# Patient Record
Sex: Female | Born: 1995 | Race: Black or African American | Hispanic: No | Marital: Single | Smoking: Never smoker
Health system: Southern US, Community
[De-identification: ages and names within clinical notes are randomized; demographics above are authoritative.]

## PROBLEM LIST (undated history)

## (undated) DIAGNOSIS — Z9109 Other allergy status, other than to drugs and biological substances: Secondary | ICD-10-CM

## (undated) DIAGNOSIS — L309 Dermatitis, unspecified: Secondary | ICD-10-CM

---
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
## 2016-02-05 ENCOUNTER — Emergency Department (HOSPITAL_COMMUNITY)
Admission: EM | Admit: 2016-02-05 | Discharge: 2016-02-05 | Disposition: A | Discharge status: Home / Self Care | Payer: Medicaid Other | Attending: Emergency Medicine | Admitting: Emergency Medicine

## 2016-02-05 ENCOUNTER — Encounter (HOSPITAL_COMMUNITY): Payer: Self-pay | Admitting: Emergency Medicine

## 2016-02-05 DIAGNOSIS — R519 Headache, unspecified: Secondary | ICD-10-CM

## 2016-02-05 DIAGNOSIS — R51 Headache: Secondary | ICD-10-CM | POA: Diagnosis present

## 2016-02-05 DIAGNOSIS — Z79899 Other long term (current) drug therapy: Secondary | ICD-10-CM | POA: Insufficient documentation

## 2016-02-05 MED ORDER — FLUTICASONE PROPIONATE 50 MCG/ACT NA SUSP: 1.0000 | Freq: Every day | NASAL | Status: DC

## 2016-02-05 MED ORDER — CETIRIZINE HCL 5 MG PO TABS
5.0000 mg | ORAL_TABLET | Freq: Every day | ORAL | Status: DC
Start: 2016-02-05 — End: 2016-11-25

## 2016-02-05 NOTE — ED Notes (Signed)
Patient arrives with complaint of aching pain to a specific area behind her right ear. Describes it as a "knot". Area appears mildly raised and soft, nontender. Patient states moderate nagging ache to that area with periodic radiation to rest of head. Denies neuro/vision abnormalities. Denies history of idiopathic migraines, but states that "loud music causes them".

## 2016-02-05 NOTE — Discharge Instructions (Signed)
Read the information below.   Your neurologic exam was re-assuring.  I have prescribed flonase and an allergy medication for relief of your allergy symptoms.  Use the prescribed medication as directed.  Please discuss all new medications with your pharmacist.   It is important that you follow up with a primary care provider. I have provided the [facility] information [facility].  You may return to the Emergency Department at any time for worsening condition or any new symptoms that concern you. Return to ED if your symptoms worsen or you develop any new symptoms such as dizziness, lightheadedness, changes in vision, numbness/weakness, or fever.     General Headache Without Cause A headache is pain or discomfort felt around the head or neck area. There are many causes and types of headaches. In some cases, the cause may not be found.  HOME CARE  Managing Pain  Take over-the-counter and prescription medicines only as told by your doctor.  Lie down in a dark, quiet room when you have a headache.  If directed, apply ice to the head and neck area:  Put ice in a plastic bag.  Place a towel between your skin and the bag.  Leave the ice on for 20 minutes, 2-3 times per day.  Use a heating pad or hot shower to apply heat to the head and neck area as told by your doctor.  Keep lights dim if bright lights bother you or make your headaches worse. Eating and Drinking  Eat meals on a regular schedule.  Lessen how much alcohol you drink.  Lessen how much caffeine you drink, or stop drinking caffeine. General Instructions  Keep all follow-up visits as told by your doctor. This is important.  Keep a journal to find out if certain things bring on headaches. For example, write down:  What you eat and drink.  How much sleep you get.  Any change to your diet or medicines.  Relax by getting a massage or doing other relaxing activities.  Lessen stress.  Sit up  straight. Do not tighten (tense) your muscles.  Do not use tobacco products. This includes cigarettes, chewing tobacco, or e-cigarettes. If you need help quitting, ask your doctor.  Exercise regularly as told by your doctor.  Get enough sleep. This often means 7-9 hours of sleep. GET HELP IF:  Your symptoms are not helped by medicine.  You have a headache that feels different than the other headaches.  You feel sick to your stomach (nauseous) or you throw up (vomit).  You have a fever. GET HELP RIGHT AWAY IF:   Your headache becomes really bad.  You keep throwing up.  You have a stiff neck.  You have trouble seeing.  You have trouble speaking.  You have pain in the eye or ear.  Your muscles are weak or you lose muscle control.  You lose your balance or have trouble walking.  You feel like you will pass out (faint) or you pass out.  You have confusion.   This information is not intended to replace advice given to you by your health care provider. Make sure you discuss any questions you have with your health care provider.   Document Released: 04/27/2008 Document Revised: 04/09/2015 Document Reviewed: 11/11/2014 Elsevier Interactive Patient Education [employer] Inc[employer]

## 2016-02-05 NOTE — ED Provider Notes (Signed)
CSN: [id number]     Arrival date & time 02/05/16  2026 History  By signing my name below, I, [facility] Washington, attest that this documentation has been prepared under the direction and in the presence of Arvilla MeresAshley Meyer, PA-C. Electronically Signed: Randell PatientMarrissa Washington, ED Scribe. 02/05/2016. 10:31 PM.   Chief Complaint  Patient presents with  . Headache    The history is provided by the patient. No language interpreter was used.    HPI Comments: Caroline Scott is a 20 y.o. female with a hx of environmental allergies and eczema who presents to the Emergency Department complaining of constant, non-tender, unchanging bump behind her right ear that appeared 2 months ago. Pt reports no recent injuries to the head or ear. She reports intermittent aching sensation at site of knot with occasional radiation to rest of head. She reports associated intermittent HAs, intermittent right ear pain, and intermittent nasal congestion. When she has a headache it is gradual in onset, throbbing, and 6/10 in severity.  No appreciable pattern. Per pt, she has an hx of migraines (triggered by loud music) and seasonal allergies. She notes that she recently changed [facility]Ps to [facility]. Denies recent insect bites. Denies taking allergy medication currently. Denies visual changes, neck pain, fevers, chills, night diaphoresis, nausea, sore throat, pruritis, lightheadedness, dizziness, numbness, or weakness. She currently does not have a headache.   Past Medical History  Diagnosis Date  . Eczema   . Environmental allergies    History reviewed. No pertinent past surgical history. Family History  Problem Relation Age of Onset  . Hyperlipidemia Mother   . Hypertension Mother   . Cancer Other   . Hypertension Other   . Dementia Other    Social History  Substance Use Topics  . Smoking status: Never Smoker   . Smokeless tobacco: None  . Alcohol Use: No   OB History    No data available     Review of  Systems  Constitutional: Negative for fever, chills and diaphoresis.  HENT: Positive for congestion (intermittent) and ear pain (intermittent). Negative for sore throat.   Eyes: Negative for visual disturbance.  Gastrointestinal: Negative for nausea.  Musculoskeletal: Negative for neck pain.  Skin: Negative for rash.       Knot on scalp  Neurological: Positive for headaches (intermittent, none currently). Negative for dizziness, syncope, weakness, light-headedness and numbness.      Allergies  Review of patient's allergies indicates no known allergies.  Home Medications   Prior to Admission medications   Medication Sig Start Date End Date Taking? Authorizing Provider  ibuprofen (ADVIL,MOTRIN) 200 MG tablet Take 200 mg by mouth every 6 (six) hours as needed.   Yes Historical Provider, MD  cetirizine (ZYRTEC) 5 MG tablet Take 1 tablet (5 mg total) by mouth daily. 02/05/16   Lona KettleAshley Laurel Meyer, PA-C  docusate sodium (COLACE) 100 MG capsule Take 1 capsule (100 mg total) by mouth every 12 (twelve) hours. Patient not taking: Reported on 02/05/2016 09/04/14   Mathis FareJennifer Lee H Presson, PA  fluticasone (FLONASE) 50 MCG/ACT nasal spray Place 1 spray into both nostrils daily. 02/05/16   Lona KettleAshley Laurel Meyer, PA-C  HYDROcodone-acetaminophen (NORCO) 5-325 MG tablet Take 1-2 tablets by mouth every 6 (six) hours as needed. Patient not taking: Reported on 02/05/2016 09/27/15   Geoffery Lyonsouglas Delo, MD  ibuprofen (ADVIL,MOTRIN) 400 MG tablet Take 1 tablet (400 mg total) by mouth every 6 (six) hours as needed for pain. Patient not taking: Reported on 02/05/2016 07/11/12  Linwood DibblesJon Knapp, MD   BP 110/62 mmHg  Pulse 83  Temp(Src) 98.5 F (36.9 C) (Oral)  Resp 16  Ht 5\' 5"  (1.651 m)  Wt 157 lb (71.215 kg)  BMI 26.13 kg/m2  SpO2 100%  LMP 02/05/2016 (Exact Date) Physical Exam  Constitutional: She appears well-developed and well-nourished. No distress.  HENT:  Head: Atraumatic.  Right Ear: Tympanic membrane, external ear  and ear canal normal.  Left Ear: Tympanic membrane, external ear and ear canal normal.  Mouth/Throat: Uvula is midline, oropharynx is clear and moist and mucous membranes are normal. No trismus in the jaw. No uvula swelling. No oropharyngeal exudate.  Eyes: Conjunctivae and EOM are normal. Pupils are equal, round, and reactive to light. Right eye exhibits no discharge. Left eye exhibits no discharge. No scleral icterus.  Neck: Normal range of motion and phonation normal. Normal range of motion present.  Cardiovascular: Normal rate, regular rhythm and normal heart sounds.   Pulmonary/Chest: Effort normal and breath sounds normal. No respiratory distress. She has no wheezes.  Lymphadenopathy:    She has no cervical adenopathy.  Neurological: She is alert. Coordination normal. GCS eye subscore is 4. GCS verbal subscore is 5. GCS motor subscore is 6.  Mental Status:  Alert, thought content appropriate, able to give a coherent history. Speech fluent without evidence of aphasia. Able to follow 2 step commands without difficulty.  Cranial Nerves:  II:  Peripheral visual fields grossly normal, pupils equal, round, reactive to light III,IV, VI: ptosis not present, extra-ocular motions intact bilaterally  V,VII: smile symmetric, facial light touch sensation equal VIII: hearing grossly normal to voice  X: uvula elevates symmetrically  XI: bilateral shoulder shrug symmetric and strong XII: midline tongue extension without fassiculations Motor:  Normal tone. 5/5 in upper and lower extremities bilaterally including strong and equal grip strength and dorsiflexion/plantar flexion Sensory: light touch normal in all extremities.  Cerebellar: normal finger-to-nose with bilateral upper extremities Gait: normal gait and balance CV: distal pulses palpable throughout   Skin: Skin is warm and dry. She is not diaphoretic.     Psychiatric: She has a normal mood and affect. Her behavior is normal.  Nursing note  and vitals reviewed.   ED Course  Procedures   DIAGNOSTIC STUDIES: Oxygen Saturation is 99% on RA, normal by my interpretation.    COORDINATION OF CARE: 9:50 PM Advised pt to follow-up with PCP. Will prescribe Flonase and Zyrtec. Will discharge pt. Discussed treatment plan with pt at bedside and pt agreed to plan.   MDM   Final diagnoses:  Nonintractable headache, unspecified chronicity pattern, unspecified headache type    Patient is afebrile and non-toxic appearing in NAD. Vital sins are stable. She currently does not have a headache. Physical exam remarkable for 2cm, smooth, round, non-tender mass in scalp posterior to right ear. Normal neurologic exam. Regarding headaches - low suspicion for [facility], ICH, or meningitis. Regarding mass on scalp - suspect ?lipoma vs. ?Cyst vs. ?Lymph node. Encouraged pt to follow up with a primary care provider. [facility]. Discussed symptomatic tx of headaches and allergies. Rx zyrtec and flonase. Return precautions discussed. Pt. Voiced understanding and is agreeable.   I personally performed the services described in this documentation, which was scribed in my presence. The recorded information has been reviewed and is accurate.    Lona Kettleshley Laurel Meyer, PA-C 02/07/16 1804  Richardean Canalavid H Yao, MD 02/08/16 Ernestina [id number]

## 2016-02-05 NOTE — ED Notes (Signed)
Denies injury to head.

## 2016-04-04 ENCOUNTER — Encounter (HOSPITAL_COMMUNITY): Payer: Self-pay | Admitting: Emergency Medicine

## 2016-04-04 ENCOUNTER — Ambulatory Visit (HOSPITAL_COMMUNITY)
Admission: EM | Admit: 2016-04-04 | Discharge: 2016-04-04 | Disposition: A | Discharge status: Home / Self Care | Payer: Medicaid Other | Attending: Family Medicine | Admitting: Family Medicine

## 2016-04-04 DIAGNOSIS — J029 Acute pharyngitis, unspecified: Secondary | ICD-10-CM | POA: Diagnosis present

## 2016-04-04 DIAGNOSIS — J039 Acute tonsillitis, unspecified: Secondary | ICD-10-CM | POA: Insufficient documentation

## 2016-04-04 LAB — POCT RAPID STREP A: Streptococcus, Group A Screen (Direct): NEGATIVE

## 2016-04-04 MED ORDER — AMOXICILLIN 500 MG PO CAPS: 500.0000 mg | ORAL_CAPSULE | Freq: Three times a day (TID) | ORAL | 0 refills | Status: DC

## 2016-04-04 NOTE — ED Triage Notes (Signed)
[facility] a [facility] of a sore throat and swollen glands in her neck that started yesterday.

## 2016-04-04 NOTE — ED Provider Notes (Signed)
CSN: [id number]     Arrival date & time 04/04/16  1425 History   First MD Initiated Contact with Patient 04/04/16 1511     Chief Complaint  Patient presents with  . Sore Throat   (Consider location/radiation/quality/duration/timing/severity/associated sxs/prior Treatment) HPI History obtained from patient:   LOCATION:throat SEVERITY:6 DURATION:1 day CONTEXT:sudden onset yesterday. QUALITY:scratchy MODIFYING FACTORS:OTC meds without relief ASSOCIATED SYMPTOMS:hurts to swallow TIMING:now constant  Past Medical History:  Diagnosis Date  . Eczema   . Environmental allergies    History reviewed. No pertinent surgical history. Family History  Problem Relation Age of Onset  . Hyperlipidemia Mother   . Hypertension Mother   . Cancer Other   . Hypertension Other   . Dementia Other    Social History  Substance Use Topics  . Smoking status: Never Smoker  . Smokeless tobacco: Not on file  . Alcohol use No   OB History    No data available     Review of Systems  Denies: HEADACHE, NAUSEA, ABDOMINAL PAIN, CHEST PAIN, CONGESTION, DYSURIA, SHORTNESS OF BREATH  Allergies  Review of patient's allergies indicates no known allergies.  Home Medications   Prior to Admission medications   Medication Sig Start Date End Date Taking? Authorizing Provider  cetirizine (ZYRTEC) 5 MG tablet Take 1 tablet (5 mg total) by mouth daily. 02/05/16   Lona Kettle, PA-C  docusate sodium (COLACE) 100 MG capsule Take 1 capsule (100 mg total) by mouth every 12 (twelve) hours. Patient not taking: Reported on 02/05/2016 09/04/14   Mathis Fare Presson, PA  fluticasone (FLONASE) 50 MCG/ACT nasal spray Place 1 spray into both nostrils daily. 02/05/16   Lona Kettle, PA-C  HYDROcodone-acetaminophen (NORCO) 5-325 MG tablet Take 1-2 tablets by mouth every 6 (six) hours as needed. Patient not taking: Reported on 02/05/2016 09/27/15   Geoffery Lyons, MD  ibuprofen (ADVIL,MOTRIN) 200 MG tablet Take 200 mg  by mouth every 6 (six) hours as needed.    Historical Provider, MD  ibuprofen (ADVIL,MOTRIN) 400 MG tablet Take 1 tablet (400 mg total) by mouth every 6 (six) hours as needed for pain. Patient not taking: Reported on 02/05/2016 07/11/12   Linwood Dibbles, MD   Meds Ordered and Administered this Visit  Medications - No data to display  BP 103/81 (BP Location: Left Arm)   Pulse 73   Temp 98 F (36.7 C) (Oral)   Resp 16   LMP 03/25/2016 (Exact Date)   SpO2 100%  No data found.   Physical Exam NURSES NOTES AND VITAL SIGNS REVIEWED. CONSTITUTIONAL: Well developed, well nourished, no acute distress HEENT: normocephalic, atraumatic, TONSILS: EXUDATE BOTH TONSILS. ODOR TO BREATH. EYES: Conjunctiva normal NECK:normal ROM, supple, no adenopathy PULMONARY:No respiratory distress, normal effort ABDOMINAL: Soft, ND, NT BS+, No CVAT MUSCULOSKELETAL: Normal ROM of all extremities,  SKIN: warm and dry without rash PSYCHIATRIC: Mood and affect, behavior are normal  Urgent Care Course   Clinical Course    Procedures (including critical care time)  Labs Review Labs Reviewed  POCT RAPID STREP A    Imaging Review No results found.   Visual Acuity Review  Right Eye Distance:   Left Eye Distance:   Bilateral Distance:    Right Eye Near:   Left Eye Near:    Bilateral Near:        AMOXIL MDM   1. Exudative tonsillitis     Patient is reassured that there are no issues that require transfer to higher level of care at  this time or additional tests. Patient is advised to continue home symptomatic treatment. Patient is advised that if there are new or worsening symptoms to attend the emergency department, contact primary care provider, or return to UC. Instructions of care provided discharged home in stable condition.    THIS NOTE WAS GENERATED USING A VOICE RECOGNITION SOFTWARE PROGRAM. ALL REASONABLE EFFORTS  WERE MADE TO PROOFREAD THIS DOCUMENT FOR ACCURACY.  I have verbally  reviewed the discharge instructions with the patient. A printed AVS was given to the patient.  All questions were answered prior to discharge.      Tharon AquasFrank C Patrick, PA 04/04/16 2022

## 2016-04-07 LAB — CULTURE, GROUP A STREP (THRC)

## 2016-04-11 ENCOUNTER — Encounter (HOSPITAL_COMMUNITY): Payer: Self-pay | Admitting: Emergency Medicine

## 2016-04-11 ENCOUNTER — Ambulatory Visit (HOSPITAL_COMMUNITY)
Admission: EM | Admit: 2016-04-11 | Discharge: 2016-04-11 | Disposition: A | Discharge status: Home / Self Care | Payer: Medicaid Other | Attending: Family Medicine | Admitting: Family Medicine

## 2016-04-11 DIAGNOSIS — N898 Other specified noninflammatory disorders of vagina: Secondary | ICD-10-CM

## 2016-04-11 DIAGNOSIS — L299 Pruritus, unspecified: Secondary | ICD-10-CM | POA: Diagnosis present

## 2016-04-11 DIAGNOSIS — Z8249 Family history of ischemic heart disease and other diseases of the circulatory system: Secondary | ICD-10-CM | POA: Insufficient documentation

## 2016-04-11 LAB — POCT URINALYSIS DIP (DEVICE)
Bilirubin Urine: NEGATIVE
GLUCOSE, UA: NEGATIVE mg/dL
HGB URINE DIPSTICK: NEGATIVE
KETONES UR: NEGATIVE mg/dL
Nitrite: NEGATIVE
Protein, ur: 30 mg/dL — AB
SPECIFIC GRAVITY, URINE: 1.025 (ref 1.005–1.030)
Urobilinogen, UA: 1 mg/dL (ref 0.0–1.0)
pH: 7 (ref 5.0–8.0)

## 2016-04-11 MED ORDER — METRONIDAZOLE 500 MG PO TABS: 500.0000 mg | ORAL_TABLET | Freq: Two times a day (BID) | ORAL | 0 refills | Status: DC

## 2016-04-11 MED ORDER — FLUCONAZOLE 150 MG PO TABS: 150.0000 mg | ORAL_TABLET | Freq: Every day | ORAL | 0 refills | Status: DC

## 2016-04-11 NOTE — Discharge Instructions (Signed)
If symptoms worsen you should return to the urgent care or go to the emergency room.    Refrain from sexual intercourse until symptoms completely resolved.  Schedule appointment with primary care physician for recheck.

## 2016-04-11 NOTE — ED Triage Notes (Signed)
[facility] a [facility] vaginal itching and irritation that started 2 days into an amoxicillin regiment for tonsillitis . The patient stated that today was her last dose.

## 2016-04-11 NOTE — ED Provider Notes (Signed)
[facility]    CSN: [id number] Arrival date & time: 04/11/16  1440  First Provider Contact:  First MD Initiated Contact with Patient 04/11/16 1556        History   Chief Complaint Chief Complaint  Patient presents with  . Vaginal Itching    HPI Caroline Scott is a 20 y.o. female.   HPI Patient presents with the above complaint. Patient was seen in our clinic 04 April 2016 and treated with amoxicillin for tonsillitis.  States that about 3 days after she began having vaginal itching and discharge.  Denies bleeding, dysuria, abd/pelvic pain, nausea, vomiting, fever, chills. She is sexually active and last partner was about a couple of weeks ago.  Last menstrual cycle end of august.   Past Medical History:  Diagnosis Date  . Eczema   . Environmental allergies     There are no active problems to display for this patient.   History reviewed. No pertinent surgical history.  OB History    No data available       Home Medications    Prior to Admission medications   Medication Sig Start Date End Date Taking? Authorizing Provider  amoxicillin (AMOXIL) 500 MG capsule Take 1 capsule (500 mg total) by mouth 3 (three) times daily. 04/04/16  Yes Tharon AquasFrank C Patrick, PA  cetirizine (ZYRTEC) 5 MG tablet Take 1 tablet (5 mg total) by mouth daily. 02/05/16   Lona KettleAshley Laurel Meyer, PA-C  docusate sodium (COLACE) 100 MG capsule Take 1 capsule (100 mg total) by mouth every 12 (twelve) hours. Patient not taking: Reported on 02/05/2016 09/04/14   Mathis FareJennifer Lee H Presson, PA  fluticasone (FLONASE) 50 MCG/ACT nasal spray Place 1 spray into both nostrils daily. 02/05/16   Lona KettleAshley Laurel Meyer, PA-C  HYDROcodone-acetaminophen (NORCO) 5-325 MG tablet Take 1-2 tablets by mouth every 6 (six) hours as needed. Patient not taking: Reported on 02/05/2016 09/27/15   Geoffery Lyonsouglas Delo, MD  ibuprofen (ADVIL,MOTRIN) 200 MG tablet Take 200 mg by mouth every 6 (six) hours as needed.    Historical Provider, MD    ibuprofen (ADVIL,MOTRIN) 400 MG tablet Take 1 tablet (400 mg total) by mouth every 6 (six) hours as needed for pain. Patient not taking: Reported on 02/05/2016 07/11/12   Linwood DibblesJon Knapp, MD    Family History Family History  Problem Relation Age of Onset  . Hyperlipidemia Mother   . Hypertension Mother   . Cancer Other   . Hypertension Other   . Dementia Other     Social History Social History  Substance Use Topics  . Smoking status: Never Smoker  . Smokeless tobacco: Never Used  . Alcohol use No     Allergies   Review of patient's allergies indicates no known allergies.   Review of Systems Review of Systems  Constitutional: Negative.   HENT: Negative.   Eyes: Negative.   Respiratory: Negative.   Cardiovascular: Negative.   Gastrointestinal: Negative.   Genitourinary: Positive for genital sores and vaginal discharge. Negative for difficulty urinating, dysuria, flank pain, hematuria, menstrual problem, pelvic pain, vaginal bleeding and vaginal pain.  Musculoskeletal: Negative.   Neurological: Negative.   Psychiatric/Behavioral: Negative.      Physical Exam Triage Vital Signs ED Triage Vitals  Enc Vitals Group     BP 04/11/16 1532 104/71     Pulse Rate 04/11/16 1532 69     Resp 04/11/16 1532 18     Temp 04/11/16 1532 98 F (36.7 C)     Temp  Source 04/11/16 1532 Oral     SpO2 04/11/16 1532 100 %     Weight --      Height --      Head Circumference --      Peak Flow --      Pain Score 04/11/16 1533 8     Pain Loc --      Pain Edu? --      Excl. in GC? --    No data found.   Updated Vital Signs BP 104/71 (BP Location: Left Arm)   Pulse 69   Temp 98 F (36.7 C) (Oral)   Resp 18   LMP 03/25/2016 (Exact Date)   SpO2 100%   Visual Acuity Right Eye Distance:   Left Eye Distance:   Bilateral Distance:    Right Eye Near:   Left Eye Near:    Bilateral Near:     Physical Exam  Constitutional: She is oriented to person, place, and time. She appears  well-developed. No distress.  HENT:  Head: Normocephalic and atraumatic.  Eyes: EOM are normal.  Neck: Normal range of motion.  Cardiovascular: Normal rate.   Pulmonary/Chest: Effort normal and breath sounds normal.  Abdominal: Soft. Bowel sounds are normal. She exhibits no mass. There is tenderness (left lower quadrant with deep palpation. ).  Genitourinary: Vaginal discharge found.  Musculoskeletal: Normal range of motion.  Neurological: She is alert and oriented to person, place, and time.  Skin: Skin is warm and dry.  Psychiatric: She has a normal mood and affect. Her behavior is normal.     UC Treatments / Results  Labs (all labs ordered are listed, but only abnormal results are displayed) Labs Reviewed  POCT URINALYSIS DIP (DEVICE) - Abnormal; Notable for the following:       Result Value   Protein, ur 30 (*)    Leukocytes, UA SMALL (*)    All other components within normal limits  CERVICOVAGINAL ANCILLARY ONLY    EKG  EKG Interpretation None       Radiology No results found.  Procedures Procedures (including critical care time)  Medications Ordered in UC Medications - No data to display   Initial Impression / Assessment and Plan / UC Course  I have reviewed the triage vital signs and the nursing notes.  Pertinent labs & imaging results that were available during my care of the patient were reviewed by me and considered in my medical decision making (see chart for details).  Clinical Course      Final Clinical Impressions(s) / UC Diagnoses   Final diagnoses:  Vaginal discharge  question yeast infection after starting amoxicillin  New Prescriptions Discharge Medication List as of 04/11/2016  4:42 PM    START taking these medications   Details  fluconazole (DIFLUCAN) 150 MG tablet Take 1 tablet (150 mg total) by mouth daily., Starting Sun 04/11/2016, Normal    metroNIDAZOLE (FLAGYL) 500 MG tablet Take 1 tablet (500 mg total) by mouth 2 (two) times  daily., Starting Sun 04/11/2016, Print      will treat patient for possible yeast infection and bacterial vaginosis.  Our clinic will contact her with the results of labs when they return.  If symptoms not improved or worsen she should return to the urgent care or go to the emergency room.  Should also schedule appointment with primary care physician.  All questions answered.    Naida Sleight, PA-C 04/11/16 1710

## 2016-04-12 LAB — CERVICOVAGINAL ANCILLARY ONLY
Chlamydia: NEGATIVE
Neisseria Gonorrhea: NEGATIVE

## 2016-04-13 LAB — CERVICOVAGINAL ANCILLARY ONLY: WET PREP (BD AFFIRM): POSITIVE — AB

## 2016-04-21 ENCOUNTER — Telehealth (HOSPITAL_COMMUNITY): Payer: Self-pay | Admitting: Emergency Medicine

## 2016-04-21 NOTE — Telephone Encounter (Signed)
Called pt and notified of recent lab results Pt ID'd properly... Reports feeling better and sx have subsided States she only took the Diflucan and did not take Flagyl Notified pt that sometimes BV will resolve on its own but she needs to fill Rx if sx arise Education on safe sex given Adv pt if sx are not getting better to return or to f/u w/PCP Pt verb understanding.

## 2016-04-21 NOTE — Telephone Encounter (Signed)
-----   Message from Eustace MooreLaura W Murray, MD sent at 04/13/2016  5:44 PM EDT ----- Please let patient know that tests for candida (yeast) and gardnerella (bacterial vaginosis) were positive.   Rx for fluconazole and metronidazole were given at the urgent care visit 04/11/16.   Recheck or followup with PCP/[facility] for further evaluation if symptoms persist.  LM

## 2016-06-21 ENCOUNTER — Ambulatory Visit (HOSPITAL_COMMUNITY)
Admission: EM | Admit: 2016-06-21 | Discharge: 2016-06-21 | Discharge status: Absent without leave | Payer: Medicaid Other

## 2016-11-03 ENCOUNTER — Encounter (HOSPITAL_COMMUNITY): Payer: Self-pay | Admitting: Family Medicine

## 2016-11-03 ENCOUNTER — Ambulatory Visit (HOSPITAL_COMMUNITY)
Admission: EM | Admit: 2016-11-03 | Discharge: 2016-11-03 | Disposition: A | Discharge status: Home / Self Care | Payer: Medicaid Other | Attending: Internal Medicine | Admitting: Internal Medicine

## 2016-11-03 DIAGNOSIS — R102 Pelvic and perineal pain: Secondary | ICD-10-CM | POA: Diagnosis present

## 2016-11-03 DIAGNOSIS — N989 Complication associated with artificial fertilization, unspecified: Secondary | ICD-10-CM | POA: Diagnosis present

## 2016-11-03 DIAGNOSIS — Z202 Contact with and (suspected) exposure to infections with a predominantly sexual mode of transmission: Secondary | ICD-10-CM | POA: Diagnosis not present

## 2016-11-03 DIAGNOSIS — N898 Other specified noninflammatory disorders of vagina: Secondary | ICD-10-CM | POA: Insufficient documentation

## 2016-11-03 NOTE — Discharge Instructions (Signed)
It is important to avoid sexual activities until test results come back in 2-3 days.  If they come back positive, you will be called in the appropriate medication.  It is also recommended you have all partner(s) tested and treated.    Please [place] resource guide to help find a primary care provider and women's health provider.   Emergency Department Resource Guide 1) Find a Doctor and Pay Out of Pocket Although you won't have to find out who is covered by your insurance plan, it is a good idea to ask around and get recommendations. You will then need to call the office and see if the doctor you have chosen will accept you as a new patient and what types of options they offer for patients who are self-pay. Some doctors offer discounts or will set up payment plans for their patients who do not have insurance, but you will need to ask so you aren't surprised when you get to your appointment.  2) Contact Your Local Health Department Not all health departments have doctors that can see patients for sick visits, but many do, so it is worth a call to see if yours does. If you don't know where your local health department is, you can check in your phone book. The CDC also has a tool to help you locate your state's health department, and many state websites also have listings of all of their local health departments.  3) Find a Walk-in Clinic If your illness is not likely to be very severe or complicated, you may want to try a walk in clinic. These are popping up all over the country in pharmacies, drugstores, and shopping centers. They're usually staffed by nurse practitioners or physician assistants that have been trained to treat common illnesses and complaints. They're usually fairly quick and inexpensive. However, if you have serious medical issues or chronic medical problems, these are probably not your best option.  No Primary Care Doctor: Call Health Connect at  [phone] - they can help you locate a  primary care doctor that  accepts your insurance, provides certain services, etc. Physician Referral Service- [phone]  Chronic Pain Problems: Organization         Address  Phone   Notes  [facility]  [phone] Patients need to be referred by their primary care doctor.   Medication Assistance: Organization         Address  Phone   Notes  [facility] Medication [facility] [address] [place], [place] [address] [phone] --Must be a resident of [facility] -- Must have NO insurance coverage whatsoever (no Medicaid/ Medicare, etc.) -- The pt. MUST have a primary care doctor that directs their care regularly and follows them in the community   [facility]ist  [phone]   [employer]  [phone]    Agencies that provide inexpensive medical care: Buyer, retail  Notes  [facility]  [phone]   [facility]    [phone]   [facility] [address] [place], [place] [address] [phone]   [facility] [address] [place], [place] [phone]   Planned Parenthood    [phone]   [facility]    [phone]   [facility] and [facility]  [address], [place] Phone:  [phone], Fax:  [phone] Hours of Operation:  9 am - 6 pm, M-F.  Also accepts Medicaid/Medicare and self-pay.  [facility]  [address], [place] Phone: [phone], Fax: [phone]. Hours of Operation:  8:30 am - 5:30 pm, M-F.  Also accepts Medicaid and self-pay.  [facility] [place] [address] [place] Phone: [phone]   [facility] [address] Natasha Bence [place], [place] [phone], Ext. [phone] Mondays & Thursdays:  7-9 AM.  First 15 patients are seen on a first come, first serve basis.    Medicaid-accepting [facility] Providers:  Organization         Address                                                                       Phone                               Notes  [facility] [address], [place] [phone] Also accepts self-pay patients.  [facility] [address] Laurell Josephs [place], [place]  [phone]   [facility] [address], [place] [phone]   [facility] [address] [place] [phone]   Renaye Rakers [address], [place]   [phone] Only accepts [place] Access [place] patients after they have their name applied to their card.   Self-Pay (no insurance) in [facility]:   Organization         Address                                                     Phone               Notes  Sickle Cell Patients, [facility] Internal Medicine [address] [place], [place] [phone]   [facility] [address] [place], [place] [phone]   [facility] [place]  [address], [place] [phone]   [facility]/Dr. Osei-Bonsu  [address] [place] or [address], [place] [phone] Phone number for both [place] and [place] locations is the same.  [facility]gent [facility] and [facility] [address] [place] [phone]   [facility] [address] [place] or [address] [phone]   [facility] [address], [place] [phone], phone; [phone], fax Sees patients 1st and 3rd Saturday of every month.  Must not qualify for public or private insurance (i.e. Medicaid, Medicare, [place] Health Choice, Veterans' Benefits)  Household income should be no more than 200% of the  poverty level The clinic cannot treat you if you are pregnant or think you are pregnant  Sexually transmitted diseases are not treated at the clinic.    Dental Care: Organization         Address                                  Phone                       Notes  [facility] [place] [address] [place], [place] [phone] Accepts children up to age 38 who are enrolled in [place] or [place] Health Choice; pregnant women with a Medicaid card; and children who have applied for Medicaid or [place] Health Choice, but were declined, whose parents can pay a reduced fee at time of service.  [facility] Department of [facility]  [address], [place] [phone] Accepts children up to age 60 who are enrolled in [place] or [place] Health Choice; pregnant women with a Medicaid card; and children who have applied for Medicaid or [place] Health Choice, but were declined, whose parents can pay a reduced fee at time of service.  [facility]  [address] [place], [place] [phone] Patients are seen by appointment only. Walk-ins are not accepted. [facility] will see patients 82 years of age and older. Monday - Tuesday (8am-5pm) Most Wednesdays (8:30-5pm) $30 per visit, cash only  [facility] Adult Dental Access PROGRAM  [address], [facility] [phone] Patients are seen by appointment only. Walk-ins are not accepted. [facility] will see patients 64 years of age and older. One Wednesday Evening (Monthly: Volunteer Based).  $30 per visit, cash only  [employer]  [phone] for adults; Children under age 62, call Graduate Pediatric Dentistry at [phone]. Children aged 2-14, please call [phone] to request a pediatric application.  Dental services are provided in all areas of dental care including fillings, crowns and bridges, complete and partial dentures, implants,  gum treatment, root canals, and extractions. Preventive care is also provided. Treatment is provided to both adults and children. Patients are selected via a lottery and there is often a waiting list.   [facility] [address] [place]  [phone] [email or web address]   [facility] [address] [place], [place] [phone], Ext. [phone] Second and Fourth Thursday of each month, opens at 6:30 AM; Clinic ends at 9 AM.  Patients are seen on a first-come first-served basis, and a limited number are seen during each clinic.   [facility]  [address] Ether Griffins [place], [place] [phone]   Eligibility Requirements You must have lived in [place], [place], or [place] counties for at least the last three months.   You cannot be eligible for state or federal sponsored [place], including [employer], [place], or [employer].   You generally cannot be eligible for healthcare insurance through your employer.    How to apply: Eligibility screenings are held every  Tuesday and Wednesday afternoon from 1:00 pm until 4:00 pm. You do not need an appointment for the interview!  [facility] [address] [place], [place]   [place]  [place] Department  [place]  [phone]    Behavioral Health Resources in the Community: Intensive Outpatient Programs Organization         Address                                              Phone              Notes  [place] [place]. [address] [place], [place] [phone]   [facility] Outpatient [address], [place], [place]   [facility] [address], [place], [place]   [place] [address],  [place], [place] or [phone]   Substance Abuse  Resources Organization         Address                                Phone  Notes  [facility]  [phone]   [place]  [phone]   [facility] [place]   Chinita Pester  [phone]   Residential & Outpatient Substance Abuse Program  [phone]   Psychological Services Organization         Address                                  Phone                Notes  [facility] [place]  [place]  [phone]   [place] [address] [place] or [phone]    Mobile Crisis Teams Organization         Address  Phone  Notes  [facility], Mobile Crisis Care Unit  [phone]   [facility]  [address] [place], [place]   [place] [address] [place] [phone]    Self-Help/Support Groups Organization         Address                         Phone             Notes  [place]. of [place] - variety of support groups  [place] Call for more information  Narcotics Anonymous (NA), [facility] [address], [employer] [place]  2 meetings at this location   Special educational needs teacher         Address                                                    Phone              Notes  [facility] [phone]  [address],    [place]  [phone]   [facility] [place] [facility]  [address], [place] [address], [place], [place] [phone]   [facility] Facility [address] [place], [place] [phone] Admissions: 8am-3pm M-F  [facility] [address]    [place], [place] [phone]   [facility] [address], [place], [place] [phone]   [facility] [address],  [place], [place] [phone]   [facility] - Intensive Outpatient [address] Laurell Josephs [address], [place], [place] [phone]    [facility] ([facility] [address] [place]  [place], [place] [phone] or [phone]   [facility] ([facility]) [address], [place], [place] [phone] Accepts Medicaid  [employer] [address],  [place] [phone] Substance Abuse/Addiction Treatment   [facility] Organization         Address                                                            Phone                    Notes  [facility]  [phone]   Angie Fava, PhD [address] Ervin Knack [place], [place]   [phone] or [phone]   [facility] Behavioral   [address] [place], [place] [phone]   [facility] [address] [place], [place] [phone] Insurance/Medicaid/sponsorship through [facility] [address]                                    [place], [place] [phone] Therapy/tele-psych/case  [facility] [address], [place] [phone]    Dr. Lolly Mustache  [phone]   [facility] [place]  [employer] [facility] Dept. 1) [address], [place] 2) [address], [place] 3)  [address], [place] [phone]  [phone]   [facility] Child Abuse Hotline [phone] or [phone] (After Hours)

## 2016-11-03 NOTE — ED Provider Notes (Signed)
CSN: [id number]     Arrival date & time 11/03/16  1400 History   First MD Initiated Contact with Patient 11/03/16 1452     Chief Complaint  Patient presents with  . Exposure to STD   (Consider location/radiation/quality/duration/timing/severity/associated sxs/prior Treatment) HPI Braden Deloach is a 21 y.o. female presenting to UC requesting a recheck of STIs due to continued vaginal discharge. She reports being dx and treated at the health department for chlamydia but states her symptoms never resolved. She continues to have vaginal discharge as well as mild intermitternt pelvic cramping.  She denies having intercourse since being treated 1 month ago. LMP 10/18/16.  Pt notes she does have a hx of ovarian cyst and wonders if that could be causing some of the cramping. She does not currently have a PCP or OB/GYN.    Past Medical History:  Diagnosis Date  . Eczema   . Environmental allergies    History reviewed. No pertinent surgical history. Family History  Problem Relation Age of Onset  . Hyperlipidemia Mother   . Hypertension Mother   . Cancer Other   . Hypertension Other   . Dementia Other    Social History  Substance Use Topics  . Smoking status: Never Smoker  . Smokeless tobacco: Never Used  . Alcohol use No   OB History    No data available     Review of Systems  Constitutional: Negative for chills and fever.  HENT: Negative for congestion, ear pain, sore throat, trouble swallowing and voice change.   Respiratory: Negative for cough and shortness of breath.   Cardiovascular: Negative for chest pain and palpitations.  Gastrointestinal: Negative for abdominal pain, diarrhea, nausea and vomiting.  Genitourinary: Positive for pelvic pain (cramping) and vaginal discharge. Negative for dysuria, flank pain, frequency, menstrual problem, vaginal bleeding and vaginal pain.  Musculoskeletal: Negative for arthralgias, back pain and myalgias.  Skin: Negative for rash.     Allergies  Patient has no known allergies.  Home Medications   Prior to Admission medications   Medication Sig Start Date End Date Taking? Authorizing Provider  cetirizine (ZYRTEC) 5 MG tablet Take 1 tablet (5 mg total) by mouth daily. 02/05/16   Deborha Payment, PA-C  docusate sodium (COLACE) 100 MG capsule Take 1 capsule (100 mg total) by mouth every 12 (twelve) hours. Patient not taking: Reported on 02/05/2016 09/04/14   Mathis Fare Presson, PA  fluticasone (FLONASE) 50 MCG/ACT nasal spray Place 1 spray into both nostrils daily. 02/05/16   Deborha Payment, PA-C  ibuprofen (ADVIL,MOTRIN) 200 MG tablet Take 200 mg by mouth every 6 (six) hours as needed.    Historical Provider, MD  ibuprofen (ADVIL,MOTRIN) 400 MG tablet Take 1 tablet (400 mg total) by mouth every 6 (six) hours as needed for pain. Patient not taking: Reported on 02/05/2016 07/11/12   Linwood Dibbles, MD   Meds Ordered and Administered this Visit  Medications - No data to display  BP (!) 118/56   Pulse 85   Temp 98.3 F (36.8 C)   Resp 18   LMP 10/18/2016   SpO2 100%  No data found.   Physical Exam  Constitutional: She is oriented to person, place, and time. She appears well-developed and well-nourished. No distress.  HENT:  Head: Normocephalic and atraumatic.  Eyes: EOM are normal.  Neck: Normal range of motion.  Cardiovascular: Normal rate and regular rhythm.   Pulmonary/Chest: Effort normal. No respiratory distress.  Abdominal: Soft. She exhibits no distension  and no mass. There is no tenderness. There is no rebound and no guarding.  Genitourinary:  Genitourinary Comments: Chaperoned exam. Normal external genitalia. Vaginal canal- moderate amount of white-yellow discharge. No bleeding.  No CMT, adnexal tenderness or masses.   Musculoskeletal: Normal range of motion.  Neurological: She is alert and oriented to person, place, and time.  Skin: Skin is warm and dry. She is not diaphoretic.  Psychiatric: She has a normal  mood and affect. Her behavior is normal.  Nursing note and vitals reviewed.   Urgent Care Course     Procedures (including critical care time)  Labs Review Labs Reviewed  CERVICOVAGINAL ANCILLARY ONLY    Imaging Review No results found.    MDM   1. Vaginal discharge   2. Pelvic cramping    Labs: Wet prep and GC/Chlamydia sent off.  Pt declined testing for HIV or syphilis.  Will hold off on empiric treatment due to recent reported treatment to make sure she is being treated appropriately.   Resource guide for PCP and OB/GYN also provided to pt.    Junius Finner, PA-C 11/03/16 [phone]

## 2016-11-03 NOTE — ED Triage Notes (Signed)
Pt here for STD check. Sts that she was seen and treated at the health department a month ago for chlamydia. sts her symptoms never went away. sts pelvic pain.

## 2016-11-04 LAB — CERVICOVAGINAL ANCILLARY ONLY
Chlamydia: NEGATIVE
Neisseria Gonorrhea: NEGATIVE
Wet Prep (BD Affirm): POSITIVE — AB

## 2016-11-05 ENCOUNTER — Telehealth (HOSPITAL_COMMUNITY): Payer: Self-pay | Admitting: Internal Medicine

## 2016-11-05 MED ORDER — METRONIDAZOLE 500 MG PO TABS: 500.0000 mg | ORAL_TABLET | Freq: Two times a day (BID) | ORAL | 0 refills | Status: DC

## 2016-11-05 NOTE — Telephone Encounter (Signed)
Clinical staff, please let patient know that test for gardnerella (bacterial vaginosis) was positive.  Rx metronidazole was sent to the pharmacy of record, [facility] [address] [facility].  Recheck or followup with PCP for further evaluation if symptoms are not improving.  LM

## 2016-11-06 ENCOUNTER — Telehealth (HOSPITAL_COMMUNITY): Payer: Self-pay | Admitting: Emergency Medicine

## 2016-11-06 MED ORDER — FLUCONAZOLE 150 MG PO TABS: 150.0000 mg | ORAL_TABLET | Freq: Every day | ORAL | 0 refills | Status: DC

## 2016-11-06 NOTE — Telephone Encounter (Signed)
-----   Message from Eustace Moore, MD sent at 11/05/2016 11:58 AM EDT ----- Clinical staff, please let patient know that test for gardnerella (bacterial vaginosis) was positive.  Rx metronidazole was sent to the pharmacy of record, [facility] [address] [facility].  Recheck or followup with PCP for further evaluation if symptoms are not improving.  LM

## 2016-11-06 NOTE — Telephone Encounter (Signed)
Pt called back... sts she would like for [place] to call Diflucan to her pharmacy b/c she always gets a yeast after taking antibiotics  Per Dr. Dayton Scrape, ok to call in Diflucan 150 mg #2, repeat in 72 hours if sx are not improving.   Per pt's request, [facility] into [facility] ([place]

## 2016-11-25 ENCOUNTER — Emergency Department (HOSPITAL_COMMUNITY)
Admission: EM | Admit: 2016-11-25 | Discharge: 2016-11-26 | Disposition: A | Discharge status: Home / Self Care | Payer: Medicaid Other | Attending: Emergency Medicine | Admitting: Emergency Medicine

## 2016-11-25 ENCOUNTER — Encounter (HOSPITAL_COMMUNITY): Payer: Self-pay

## 2016-11-25 DIAGNOSIS — B9689 Other specified bacterial agents as the cause of diseases classified elsewhere: Secondary | ICD-10-CM

## 2016-11-25 DIAGNOSIS — N760 Acute vaginitis: Secondary | ICD-10-CM | POA: Diagnosis not present

## 2016-11-25 DIAGNOSIS — N730 Acute parametritis and pelvic cellulitis: Secondary | ICD-10-CM | POA: Diagnosis not present

## 2016-11-25 DIAGNOSIS — Z79899 Other long term (current) drug therapy: Secondary | ICD-10-CM | POA: Diagnosis not present

## 2016-11-25 DIAGNOSIS — R102 Pelvic and perineal pain: Secondary | ICD-10-CM | POA: Diagnosis present

## 2016-11-25 LAB — URINALYSIS, ROUTINE W REFLEX MICROSCOPIC
Bilirubin Urine: NEGATIVE
Glucose, UA: NEGATIVE mg/dL
HGB URINE DIPSTICK: NEGATIVE
Ketones, ur: NEGATIVE mg/dL
LEUKOCYTES UA: NEGATIVE
NITRITE: NEGATIVE
Protein, ur: NEGATIVE mg/dL
SPECIFIC GRAVITY, URINE: 1.021 (ref 1.005–1.030)
pH: 7 (ref 5.0–8.0)

## 2016-11-25 LAB — WET PREP, GENITAL
SPERM: NONE SEEN
Trich, Wet Prep: NONE SEEN
Yeast Wet Prep HPF POC: NONE SEEN

## 2016-11-25 LAB — POC URINE PREG, ED: PREG TEST UR: NEGATIVE

## 2016-11-25 MED ORDER — LIDOCAINE HCL 1 % IJ SOLN
INTRAMUSCULAR | Status: AC
  Administered 2016-11-25: 1.2 mL
  Filled 2016-11-25: qty 20

## 2016-11-25 MED ORDER — METRONIDAZOLE 500 MG PO TABS: 500.0000 mg | ORAL_TABLET | Freq: Two times a day (BID) | ORAL | 0 refills | Status: DC

## 2016-11-25 MED ORDER — AZITHROMYCIN 250 MG PO TABS
1000.0000 mg | ORAL_TABLET | Freq: Once | ORAL | Status: AC
  Administered 2016-11-25: 1000 mg via ORAL
  Filled 2016-11-25: qty 4

## 2016-11-25 MED ORDER — DOXYCYCLINE HYCLATE 100 MG PO CAPS: 100.0000 mg | ORAL_CAPSULE | Freq: Two times a day (BID) | ORAL | 0 refills | Status: DC

## 2016-11-25 MED ORDER — CEFTRIAXONE SODIUM 250 MG IJ SOLR
250.0000 mg | Freq: Once | INTRAMUSCULAR | Status: AC
  Administered 2016-11-25: 250 mg via INTRAMUSCULAR
  Filled 2016-11-25: qty 250

## 2016-11-25 MED ORDER — METRONIDAZOLE 500 MG PO TABS
500.0000 mg | ORAL_TABLET | Freq: Once | ORAL | Status: AC
  Administered 2016-11-25: 500 mg via ORAL
  Filled 2016-11-25: qty 1

## 2016-11-25 NOTE — Discharge Instructions (Signed)
Follow up with your PCP or GYN for continued symptoms

## 2016-11-25 NOTE — ED Triage Notes (Signed)
Pt complains of breast pain and soreness Pt also complains of pelvic pain and a discharge, hx of BV and chalymia

## 2016-11-25 NOTE — ED Provider Notes (Signed)
[facility]-EMERGENCY DEPT Provider Note   CSN: [id number] Arrival date & time: 11/25/16  1903     History   Chief Complaint Chief Complaint  Patient presents with  . Pelvic Pain    HPI Caroline Scott is a 21 y.o. female.  21 yo F with a chief complaint of pelvic pain. Going on for the past 4 weeks or so. She was seen a couple weeks ago and treated for chlamydia. She thinks she still has significant symptoms. Right lower pelvic cramping. White thick discharge. No fevers or chills.   The history is provided by the patient.  Pelvic Pain  This is a new problem. The current episode started more than 2 days ago. The problem occurs constantly. The problem has not changed since onset.Pertinent negatives include no chest pain, no headaches and no shortness of breath. Nothing aggravates the symptoms. Nothing relieves the symptoms. She has tried nothing for the symptoms. The treatment provided no relief.    Past Medical History:  Diagnosis Date  . Eczema   . Environmental allergies     There are no active problems to display for this patient.   History reviewed. No pertinent surgical history.  OB History    No data available       Home Medications    Prior to Admission medications   Medication Sig Start Date End Date Taking? Authorizing Provider  doxycycline (VIBRAMYCIN) 100 MG capsule Take 1 capsule (100 mg total) by mouth 2 (two) times daily. One po bid x 7 days 11/25/16   Melene Plan, DO  metroNIDAZOLE (FLAGYL) 500 MG tablet Take 1 tablet (500 mg total) by mouth 2 (two) times daily. One po bid x 7 days 11/25/16   Melene Plan, DO    Family History Family History  Problem Relation Age of Onset  . Hyperlipidemia Mother   . Hypertension Mother   . Cancer Other   . Hypertension Other   . Dementia Other     Social History Social History  Substance Use Topics  . Smoking status: Never Smoker  . Smokeless tobacco: Never Used  . Alcohol use No     Allergies   Patient has  no known allergies.   Review of Systems Review of Systems  Constitutional: Negative for chills and fever.  HENT: Negative for congestion and rhinorrhea.   Eyes: Negative for redness and visual disturbance.  Respiratory: Negative for shortness of breath and wheezing.   Cardiovascular: Negative for chest pain and palpitations.  Gastrointestinal: Negative for nausea and vomiting.  Genitourinary: Positive for pelvic pain and vaginal discharge. Negative for dysuria and urgency.  Musculoskeletal: Negative for arthralgias and myalgias.  Skin: Negative for pallor and wound.  Neurological: Negative for dizziness and headaches.     Physical Exam Updated Vital Signs BP 121/79 (BP Location: Left Arm)   Pulse 86   Temp 98.3 F (36.8 C)   Resp 20   Wt 159 lb 9.6 oz (72.4 kg)   LMP 11/11/2016   SpO2 99%   BMI 26.56 kg/m   Physical Exam  Constitutional: She is oriented to person, place, and time. She appears well-developed and well-nourished. No distress.  HENT:  Head: Normocephalic and atraumatic.  Eyes: EOM are normal. Pupils are equal, round, and reactive to light.  Neck: Normal range of motion. Neck supple.  Cardiovascular: Normal rate and regular rhythm.  Exam reveals no gallop and no friction rub.   No murmur heard. Pulmonary/Chest: Effort normal. She has no wheezes. She has no  rales.  Abdominal: Soft. She exhibits no distension. There is no tenderness.  Genitourinary: Cervix exhibits motion tenderness and discharge (whitish). Cervix exhibits no friability.  Musculoskeletal: She exhibits no edema or tenderness.  Neurological: She is alert and oriented to person, place, and time.  Skin: Skin is warm and dry. She is not diaphoretic.  Psychiatric: She has a normal mood and affect. Her behavior is normal.  Nursing note and vitals reviewed.    ED Treatments / Results  Labs (all labs ordered are listed, but only abnormal results are displayed) Labs Reviewed  WET PREP, GENITAL -  Abnormal; Notable for the following:       Result Value   Clue Cells Wet Prep HPF POC PRESENT (*)    WBC, Wet Prep HPF POC FEW (*)    All other components within normal limits  URINALYSIS, ROUTINE W REFLEX MICROSCOPIC - Abnormal; Notable for the following:    APPearance HAZY (*)    All other components within normal limits  RPR  HIV ANTIBODY (ROUTINE TESTING)  POC URINE PREG, ED  GC/CHLAMYDIA PROBE AMP ([place]) NOT AT [facility]    EKG  EKG Interpretation None       Radiology No results found.  Procedures Procedures (including critical care time)  Medications Ordered in ED Medications  cefTRIAXone (ROCEPHIN) injection 250 mg (not administered)  azithromycin (ZITHROMAX) tablet 1,000 mg (not administered)  metroNIDAZOLE (FLAGYL) tablet 500 mg (not administered)     Initial Impression / Assessment and Plan / ED Course  I have reviewed the triage vital signs and the nursing notes.  Pertinent labs & imaging results that were available during my care of the patient were reviewed by me and considered in my medical decision making (see chart for details).     21 yo F With continued pelvic pain after being treated for chlamydia. On exam with cervical motion tenderness. We'll give a course of doxycycline. [facility]reat for [facility] here in the ED. Also found to have BV.  10:33 PM:  I have discussed the diagnosis/risks/treatment options with the patient and family and believe the pt to be eligible for discharge home to follow-up with PCP. We also discussed returning to the ED immediately if new or worsening sx occur. We discussed the sx which are most concerning (e.g., sudden worsening pain, fever, inability to tolerate by mouth) that necessitate immediate return. Medications administered to the patient during their visit and any new prescriptions provided to the patient are listed below.  Medications given during this visit Medications  cefTRIAXone (ROCEPHIN) injection 250 mg (not  administered)  azithromycin (ZITHROMAX) tablet 1,000 mg (not administered)  metroNIDAZOLE (FLAGYL) tablet 500 mg (not administered)     The patient appears reasonably screen and/or stabilized for discharge and I doubt any other medical condition or other [facility] requiring further screening, evaluation, or treatment in the ED at this time prior to discharge.    Final Clinical Impressions(s) / ED Diagnoses   Final diagnoses:  BV (bacterial vaginosis)  PID (acute pelvic inflammatory disease)    New Prescriptions New Prescriptions   DOXYCYCLINE (VIBRAMYCIN) 100 MG CAPSULE    Take 1 capsule (100 mg total) by mouth 2 (two) times daily. One po bid x 7 days   METRONIDAZOLE (FLAGYL) 500 MG TABLET    Take 1 tablet (500 mg total) by mouth 2 (two) times daily. One po bid x 7 days     Melene Plan, DO 11/25/16 2233

## 2016-11-25 NOTE — ED Notes (Signed)
Pt.REFUSED to be discharged at this time, stated that her " main problem coming to ED tonight is her pain on her LEFT BREAST at 10/10 ,x 2 months. Denied discharges. Will notify MD.

## 2016-11-25 NOTE — ED Notes (Signed)
ED Provider at bedside for pt.'s concern about breast pain, no new order.

## 2016-11-25 NOTE — ED Notes (Signed)
ED Provider at bedside. 

## 2016-11-26 LAB — GC/CHLAMYDIA PROBE AMP (~~LOC~~) NOT AT ARMC
CHLAMYDIA, DNA PROBE: NEGATIVE
NEISSERIA GONORRHEA: NEGATIVE

## 2016-11-26 LAB — HIV ANTIBODY (ROUTINE TESTING W REFLEX): HIV SCREEN 4TH GENERATION: NONREACTIVE

## 2016-11-26 LAB — RPR: RPR Ser Ql: NONREACTIVE

## 2016-12-05 ENCOUNTER — Other Ambulatory Visit (HOSPITAL_COMMUNITY): Payer: Self-pay | Admitting: Student

## 2016-12-05 ENCOUNTER — Other Ambulatory Visit: Payer: Self-pay | Admitting: Student

## 2016-12-05 ENCOUNTER — Inpatient Hospital Stay (HOSPITAL_COMMUNITY)
Admission: AD | Admit: 2016-12-05 | Discharge: 2016-12-05 | Disposition: A | Discharge status: Home / Self Care | Payer: Medicaid Other | Source: Ambulatory Visit | Attending: Obstetrics and Gynecology | Admitting: Obstetrics and Gynecology

## 2016-12-05 DIAGNOSIS — N739 Female pelvic inflammatory disease, unspecified: Secondary | ICD-10-CM | POA: Insufficient documentation

## 2016-12-05 DIAGNOSIS — R1030 Lower abdominal pain, unspecified: Secondary | ICD-10-CM

## 2016-12-05 DIAGNOSIS — N83201 Unspecified ovarian cyst, right side: Secondary | ICD-10-CM | POA: Insufficient documentation

## 2016-12-05 LAB — URINALYSIS, ROUTINE W REFLEX MICROSCOPIC
BACTERIA UA: NONE SEEN
BILIRUBIN URINE: NEGATIVE
GLUCOSE, UA: NEGATIVE mg/dL
Ketones, ur: NEGATIVE mg/dL
NITRITE: NEGATIVE
PROTEIN: NEGATIVE mg/dL
SPECIFIC GRAVITY, URINE: 1.031 — AB (ref 1.005–1.030)
pH: 5 (ref 5.0–8.0)

## 2016-12-05 LAB — POCT PREGNANCY, URINE: PREG TEST UR: NEGATIVE

## 2016-12-05 LAB — WET PREP, GENITAL
Clue Cells Wet Prep HPF POC: NONE SEEN
Sperm: NONE SEEN
Trich, Wet Prep: NONE SEEN
Yeast Wet Prep HPF POC: NONE SEEN

## 2016-12-05 NOTE — MAU Provider Note (Signed)
Caroline Scott is a 21 year old G0 here with complaints of pelvic pain that started last night. She completed her treatment of doxycycline on Friday for PID. She was seen in the [facility] ED  on 11-25-2016 and diagnosed with BV and PID.   She says that her pelvic pain is back since last night, although it was better last week.  She would really like an [place] to see if "she has scarring in her tubes or if her cyst is ok". History     CSN: [id number]  Arrival date and time: 12/05/16 1150   First Provider Initiated Contact with Patient 12/05/16 1321      Chief Complaint  Patient presents with  . Pelvic Pain   Abdominal Pain  This is a new problem. The current episode started yesterday. Onset quality: it lasted for about 5 minutes last night and then came back this  moprning for two minutes and then came back when she was sitting in the bed here.  The problem occurs 2 to 4 times per day. The problem has been unchanged. The pain is located in the RLQ. The pain is at a severity of 6/10. Quality: "feels like something is there; its not cramping or stabbing or burning" The abdominal pain does not radiate. Associated symptoms include diarrhea. Pertinent negatives include no constipation, nausea or vomiting. Treatments tried: she has been taking flagyl and doxyclycine. The treatment provided significant relief.    OB History    No data available      Past Medical History:  Diagnosis Date  . Eczema   . Environmental allergies     No past surgical history on file.  Family History  Problem Relation Age of Onset  . Hyperlipidemia Mother   . Hypertension Mother   . Cancer Other   . Hypertension Other   . Dementia Other     Social History  Substance Use Topics  . Smoking status: Never Smoker  . Smokeless tobacco: Never Used  . Alcohol use No    Allergies: No Known Allergies  No prescriptions prior to admission.    Review of Systems  Respiratory: Negative.   Cardiovascular: Negative.    Gastrointestinal: Positive for abdominal pain and diarrhea. Negative for constipation, nausea and vomiting.  Genitourinary: Negative.   Psychiatric/Behavioral: Negative.    Physical Exam   Last menstrual period 11/11/2016.  Physical Exam  Constitutional: She is oriented to person, place, and time. She appears well-developed and well-nourished.  Neck: Normal range of motion.  Respiratory: Effort normal.  GI: Soft. She exhibits no distension and no mass. There is tenderness. There is no rebound and no guarding.  Slight tenderness over right ovary, no masses palpated  Genitourinary:  Genitourinary Comments: NEFG; no CMT, no suprapubic tenderness, slight tenderness over right adnexa but no masses palpated  Neurological: She is alert and oriented to person, place, and time.  Skin: Skin is warm and dry.  Psychiatric: She has a normal mood and affect.    MAU Course  Procedures  MDM -wet prep: negative -WU:JWJXB leuks -UPT: negative -GC CT pending  Assessment and Plan   1. Lower abdominal pain    2. Patient stable for discharge. Discussed with patient that her PID diagnosis at the ED was probably premature as patient did not have large WBCs on her wet prep, fever or severe pain. Patient reassured by this information.  3. [place] to evaluate cyst ordered; message sent to clinic to set up appointment with MD.  4.  Patient stable for discharge; all questions answered.   Charlesetta GaribaldiKathryn Lorraine Kendrew Paci CNM 12/05/2016, 1:21 PM

## 2016-12-05 NOTE — Discharge Instructions (Signed)
Ovarian Cyst °An ovarian cyst is a fluid-filled sac on an ovary. The ovaries are organs that make eggs in women. Most ovarian cysts go away on their own and are not cancerous (are benign). Some cysts need treatment. °Follow these instructions at home: °· Take over-the-counter and prescription medicines only as told by your doctor. °· Do not drive or use heavy machinery while taking prescription pain medicine. °· Get pelvic exams and Pap tests as often as told by your doctor. °· Return to your normal activities as told by your doctor. Ask your doctor what activities are safe for you. °· Do not use any products that contain nicotine or tobacco, such as cigarettes and e-cigarettes. If you need help quitting, ask your doctor. °· Keep all follow-up visits as told by your doctor. This is important. °Contact a doctor if: °· Your periods are: °¨ Late. °¨ Irregular. °¨ Painful. °· Your periods stop. °· You have pelvic pain that does not go away. °· You have pressure on your bladder. °· You have trouble making your bladder empty when you pee (urinate). °· You have pain during sex. °· You have any of the following in your belly (abdomen): °¨ A feeling of fullness. °¨ Pressure. °¨ Discomfort. °¨ Pain that does not go away. °¨ Swelling. °· You feel sick most of the time. °· You have trouble pooping (have constipation). °· You are not as hungry as usual (you lose your appetite). °· You get very bad acne. °· You start to have more hair on your body and face. °· You are gaining weight or losing weight without changing your exercise and eating habits. °· You think you may be pregnant. °Get help right away if: °· You have belly pain that is very bad or gets worse. °· You cannot eat or drink without throwing up (vomiting). °· You suddenly get a fever. °· Your period is a lot heavier than usual. °This information is not intended to replace advice given to you by your health care provider. Make sure you discuss any questions you have  with your health care provider. °Document Released: 01/05/2008 Document Revised: 02/06/2016 Document Reviewed: 12/21/2015 °Elsevier Interactive Patient Education © 2017 Elsevier Inc. ° °

## 2016-12-05 NOTE — Progress Notes (Addendum)
Presents to triage for pelvic pain that started yesterday. Denies bleeding. Last intercourse was Jan 11.   VSS see flow sheet for details.   Hx of PID right side and with ovarian cyst. Completed tx last Friday and same pain back yesterday. PT stated no intercourse since being tx for PID.   1317: Provider at bs assessing pt.   1337: wetprep, GC done.   1450: Discharge instructions given with pt understanding. Pt left unit via ambulatory

## 2016-12-06 LAB — GC/CHLAMYDIA PROBE AMP (~~LOC~~) NOT AT ARMC
Chlamydia: NEGATIVE
NEISSERIA GONORRHEA: NEGATIVE

## 2016-12-13 ENCOUNTER — Telehealth: Payer: Self-pay | Admitting: *Deleted

## 2016-12-13 DIAGNOSIS — N83201 Unspecified ovarian cyst, right side: Secondary | ICD-10-CM

## 2016-12-13 DIAGNOSIS — R102 Pelvic and perineal pain: Secondary | ICD-10-CM

## 2016-12-13 NOTE — Telephone Encounter (Signed)
Called pt and informed her of need for US as ordered during recent visit to MAU.  It has been scheduled on 5/22 @ 0900 and she will need to have a full bladder. Pt will be contacted with appt in office for follow up and to obtain results of US.  Pt voiced understanding and agreed to plan of care. She stated that a message can be left on her voice mail regarding office appt.

## 2016-12-21 ENCOUNTER — Ambulatory Visit (HOSPITAL_COMMUNITY)
Admission: RE | Admit: 2016-12-21 | Discharge: 2016-12-21 | Disposition: A | Discharge status: Home / Self Care | Payer: Self-pay | Source: Ambulatory Visit | Attending: Student | Admitting: Student

## 2016-12-21 DIAGNOSIS — R102 Pelvic and perineal pain: Secondary | ICD-10-CM | POA: Insufficient documentation

## 2016-12-21 DIAGNOSIS — R1030 Lower abdominal pain, unspecified: Secondary | ICD-10-CM | POA: Insufficient documentation

## 2016-12-21 DIAGNOSIS — N83201 Unspecified ovarian cyst, right side: Secondary | ICD-10-CM | POA: Insufficient documentation

## 2017-01-04 ENCOUNTER — Encounter: Payer: Self-pay | Admitting: Obstetrics & Gynecology

## 2017-01-04 ENCOUNTER — Ambulatory Visit (INDEPENDENT_AMBULATORY_CARE_PROVIDER_SITE_OTHER): Payer: Self-pay | Admitting: Obstetrics & Gynecology

## 2017-01-04 VITALS — BP 118/73 | HR 73 | Ht 63.0 in | Wt 158.9 lb

## 2017-01-04 DIAGNOSIS — R102 Pelvic and perineal pain: Secondary | ICD-10-CM

## 2017-01-04 NOTE — Patient Instructions (Signed)
Pelvic Pain, Female Pelvic pain is pain in your lower abdomen, below your belly button and between your hips. The pain may start suddenly (acute), keep coming back (recurring), or last a long time (chronic). Pelvic pain that lasts longer than six months is considered chronic. Pelvic pain may affect your:  Reproductive organs.  Urinary system.  Digestive tract.  Musculoskeletal system.  There are many potential causes of pelvic pain. Sometimes, the pain can be a result of digestive or urinary conditions, strained muscles or ligaments, or even reproductive conditions. Sometimes the cause of pelvic pain is not known. Follow these instructions at home:  Take over-the-counter and prescription medicines only as told by your health care provider.  Rest as told by your health care provider.  Do not have sex it if hurts.  Keep a journal of your pelvic pain. Write down: ? When the pain started. ? Where the pain is located. ? What seems to make the pain better or worse, such as food or your menstrual cycle. ? Any symptoms you have along with the pain.  Keep all follow-up visits as told by your health care provider. This is important. Contact a health care provider if:  Medicine does not help your pain.  Your pain comes back.  You have new symptoms.  You have abnormal vaginal discharge or bleeding, including bleeding after menopause.  You have a fever or chills.  You are constipated.  You have blood in your urine or stool.  You have foul-smelling urine.  You feel weak or lightheaded. Get help right away if:  You have sudden severe pain.  Your pain gets steadily worse.  You have severe pain along with fever, nausea, vomiting, or excessive sweating.  You lose consciousness. This information is not intended to replace advice given to you by your health care provider. Make sure you discuss any questions you have with your health care provider. Document Released: 06/15/2004  Document Revised: 08/13/2015 Document Reviewed: 05/09/2015 Elsevier Interactive Patient Education  2018 Elsevier Inc.  

## 2017-01-04 NOTE — Progress Notes (Signed)
Subjective:     Patient ID: Caroline Scott, female   DOB: [birth year], 21 y.o.   MRN: [id number] F/u after pelvic US for pelvic pain HPI [id number] Patient's last menstrual period was 12/11/2016. US was done 5/22 after she was treated for PID 5/6 in MAU. Minimal pain noted yesterday o/w feels better  No Known Allergies Past Medical History:  Diagnosis Date  . Eczema   . Environmental allergies    History reviewed. No pertinent surgical history.   Review of Systems  Constitutional: Negative.   Genitourinary: Positive for pelvic pain. Negative for vaginal bleeding and vaginal discharge.       Objective:   Physical Exam  Constitutional: She is oriented to person, place, and time. She appears well-developed. No distress.  Cardiovascular: Normal rate.   Neurological: She is alert and oriented to person, place, and time.  Psychiatric: She has a normal mood and affect. Her behavior is normal.  CLINICAL DATA:  Lower abdominal pain  EXAM: TRANSABDOMINAL AND TRANSVAGINAL ULTRASOUND OF PELVIS  TECHNIQUE: Both transabdominal and transvaginal ultrasound examinations of the pelvis were performed. Transabdominal technique was performed for global imaging of the pelvis including uterus, ovaries, adnexal regions, and pelvic cul-de-sac. It was necessary to proceed with endovaginal exam following the transabdominal exam to visualize the LEFT ovary.  COMPARISON:  09/27/2015  FINDINGS: Uterus  Measurements: 8.8 x 3.3 x 4.6 cm. Normal morphology without mass  Endometrium  Thickness: 4 mm thick, normal. No endometrial fluid or focal abnormality  Right ovary  Measurements: 4.8 x 2.7 x 3.1 cm. Normal morphology without mass.  Left ovary  Measurements: 3.2 x 1.8 x 1.5 cm. Normal morphology without mass  Other findings  No free pelvic fluid or adnexal masses.  IMPRESSION: Normal exam.   Electronically Signed   By: Ulyses SouthwardMark  Boles M.D.   On: 12/21/2016 09:52    Assessment:     Normal pelvic US     Plan:     F/U for routine care, reassured US was nl  Adam PhenixArnold, Tanaya Dunigan G, MD 01/04/2017

## 2020-01-10 ENCOUNTER — Other Ambulatory Visit: Payer: Self-pay

## 2020-01-10 ENCOUNTER — Encounter (HOSPITAL_COMMUNITY): Payer: Self-pay | Admitting: Emergency Medicine

## 2020-01-10 ENCOUNTER — Ambulatory Visit (HOSPITAL_COMMUNITY)
Admission: EM | Admit: 2020-01-10 | Discharge: 2020-01-10 | Disposition: A | Discharge status: Home / Self Care | Payer: Self-pay | Attending: Urgent Care | Admitting: Urgent Care

## 2020-01-10 DIAGNOSIS — T148XXA Other injury of unspecified body region, initial encounter: Secondary | ICD-10-CM | POA: Insufficient documentation

## 2020-01-10 DIAGNOSIS — M791 Myalgia, unspecified site: Secondary | ICD-10-CM | POA: Insufficient documentation

## 2020-01-10 LAB — COMPREHENSIVE METABOLIC PANEL
ALT: 14 U/L (ref 0–44)
AST: 16 U/L (ref 15–41)
Albumin: 4.2 g/dL (ref 3.5–5.0)
Alkaline Phosphatase: 55 U/L (ref 38–126)
Anion gap: 9 (ref 5–15)
BUN: 15 mg/dL (ref 6–20)
CO2: 22 mmol/L (ref 22–32)
Calcium: 9.4 mg/dL (ref 8.9–10.3)
Chloride: 107 mmol/L (ref 98–111)
Creatinine, Ser: 0.6 mg/dL (ref 0.44–1.00)
GFR calc Af Amer: >60 mL/min (ref >60)
GFR calc non Af Amer: >60 mL/min (ref >60)
Glucose, Bld: 86 mg/dL (ref 70–99)
Potassium: 3.4 mmol/L — ABNORMAL LOW (ref 3.5–5.1)
Sodium: 138 mmol/L (ref 135–145)
Total Bilirubin: 0.5 mg/dL (ref 0.3–1.2)
Total Protein: 7.6 g/dL (ref 6.5–8.1)

## 2020-01-10 LAB — CBC
HCT: 40.7 % (ref 36.0–46.0)
Hemoglobin: 13.2 g/dL (ref 12.0–15.0)
MCH: 30.5 pg (ref 26.0–34.0)
MCHC: 32.4 g/dL (ref 30.0–36.0)
MCV: 94 fL (ref 80.0–100.0)
Platelets: 287 10*3/uL (ref 150–400)
RBC: 4.33 MIL/uL (ref 3.87–5.11)
RDW: 13.8 % (ref 11.5–15.5)
WBC: 7.6 10*3/uL (ref 4.0–10.5)
nRBC: 0 % (ref 0.0–0.2)

## 2020-01-10 LAB — CK: Total CK: 42 U/L (ref 38–234)

## 2020-01-10 NOTE — ED Provider Notes (Signed)
[place]   MRN: [id number] DOB: 08-16-1995  Subjective:   Caroline Scott is a 24 y.o. female presenting for 2 month hx of bruising, 1 month hx of myalgia, cramps. Feels easily fatigued, soreness even walking short distances. Has not had any clot, bleeding disorders in the family history. Maternal great grandmother died from an aneurysm.   No current facility-administered medications for this encounter. No current outpatient medications on file.   No Known Allergies  Past Medical History:  Diagnosis Date  . Eczema   . Environmental allergies      History reviewed. No pertinent surgical history.  Family History  Problem Relation Age of Onset  . Hyperlipidemia Mother   . Hypertension Mother   . Cancer Other   . Hypertension Other   . Dementia Other     Social History   Tobacco Use  . Smoking status: Never Smoker  . Smokeless tobacco: Never Used  Substance Use Topics  . Alcohol use: No  . Drug use: No    ROS   Objective:   Vitals: BP 125/77 (BP Location: Left Arm)   Pulse 76   Temp 98.3 F (36.8 C) (Oral)   Resp 16   SpO2 96%   Physical Exam Constitutional:      General: She is not in acute distress.    Appearance: Normal appearance. She is well-developed and normal weight. She is not ill-appearing, toxic-appearing or diaphoretic.  HENT:     Head: Normocephalic and atraumatic.     Right Ear: External ear normal.     Left Ear: External ear normal.     Nose: Nose normal.     Mouth/Throat:     Mouth: Mucous membranes are moist.     Pharynx: Oropharynx is clear.  Eyes:     General: No scleral icterus.    Extraocular Movements: Extraocular movements intact.     Pupils: Pupils are equal, round, and reactive to light.  Cardiovascular:     Rate and Rhythm: Normal rate and regular rhythm.     Pulses: Normal pulses.     Heart sounds: Normal heart sounds. No murmur heard.  No friction rub. No gallop.   Pulmonary:     Effort: Pulmonary  effort is normal. No respiratory distress.     Breath sounds: Normal breath sounds. No stridor. No wheezing, rhonchi or rales.  Abdominal:     General: Bowel sounds are normal. There is no distension.     Palpations: Abdomen is soft. There is no mass.     Tenderness: There is no abdominal tenderness. There is no right CVA tenderness, left CVA tenderness, guarding or rebound.  Skin:    General: Skin is warm and dry.     Coloration: Skin is not pale.     Findings: Bruising (scattered resolving bruises over lower legs) present. No rash.  Neurological:     General: No focal deficit present.     Mental Status: She is alert and oriented to person, place, and time.  Psychiatric:        Mood and Affect: Mood normal.        Behavior: Behavior normal.        Thought Content: Thought content normal.        Judgment: Judgment normal.      Assessment and Plan :   PDMP not reviewed this encounter.  1. Myalgia   2. Bruising     Labs pending, recommended patient follow-up with her PCP ASAP.  Discussed  general health maintenance otherwise. Counseled patient on potential for adverse effects with medications prescribed/recommended today, ER and return-to-clinic precautions discussed, patient verbalized understanding.    Wallis Bamberg, [place] 01/15/20 [phone]

## 2020-01-10 NOTE — ED Triage Notes (Signed)
Patient presents to urgent care today with symptoms of bruising and muscle pain. Symptoms began on around April. She states everyday she has noticed bruises on her arms and legs without any injury. Pt states even after walking short distances she feels muscle pains.

## 2020-01-11 NOTE — Progress Notes (Signed)
Labs reviewed with patient. Unremarkable results. Recommend establishing care with a new PCP, pursuing further labs for bruising. K+ is borderline low and recommended recheck with new PCP.

## 2020-11-24 ENCOUNTER — Ambulatory Visit (HOSPITAL_COMMUNITY)
Admission: EM | Admit: 2020-11-24 | Discharge: 2020-11-24 | Disposition: A | Discharge status: Home / Self Care | Payer: Medicaid Other | Attending: Family Medicine | Admitting: Family Medicine

## 2020-11-24 ENCOUNTER — Telehealth (HOSPITAL_COMMUNITY): Payer: Self-pay | Admitting: Emergency Medicine

## 2020-11-24 DIAGNOSIS — H66001 Acute suppurative otitis media without spontaneous rupture of ear drum, right ear: Secondary | ICD-10-CM

## 2020-11-24 DIAGNOSIS — J069 Acute upper respiratory infection, unspecified: Secondary | ICD-10-CM

## 2020-11-24 MED ORDER — AMOXICILLIN 875 MG PO TABS
875.0000 mg | ORAL_TABLET | Freq: Two times a day (BID) | ORAL | 0 refills | Status: DC
Start: 2020-11-24 — End: 2020-11-24

## 2020-11-24 MED ORDER — AMOXICILLIN 875 MG PO TABS: 875.0000 mg | ORAL_TABLET | Freq: Two times a day (BID) | ORAL | 0 refills | Status: DC

## 2020-11-24 NOTE — ED Triage Notes (Signed)
Pt presents with right side ear fullness and pressure with right side neck tenderness that began last week, also has nasal congestion and sneezing

## 2020-11-26 NOTE — ED Provider Notes (Signed)
  [facility]ENTER   [id number] 11/24/20 Arrival Time: 0847  ASSESSMENT & PLAN:  1. Viral URI   2. Non-recurrent acute suppurative otitis media of right ear without spontaneous rupture of tympanic membrane    OTC symptom care as needed. Discussed typical duration of viral illnesses.  Begin: Meds ordered this encounter  Medications  . amoxicillin (AMOXIL) 875 MG tablet    Sig: Take 1 tablet (875 mg total) by mouth 2 (two) times daily for 10 days.    Dispense:  20 tablet    Refill:  0     Follow-up Information    [facility] at [facility].   Specialty: Urgent Care Why: If worsening or failing to improve as anticipated. Contact information: [address] [place] [address] [phone]              Reviewed expectations re: course of current medical issues. Questions answered. Outlined signs and symptoms indicating need for more acute intervention. Understanding verbalized. After Visit Summary given.   SUBJECTIVE: History from: patient. Caroline Scott is a 25 y.o. female who reports nasal congestion for the past week; now with R otalgia desc as pressure. No ear drainage or bleeding. Mild ST. Denies: fever and difficulty breathing. Normal PO intake without n/v/d.    OBJECTIVE:  Vitals:   11/24/20 0950  BP: 127/84  Pulse: 86  Resp: 18  Temp: 98.6 F (37 C)  SpO2: 100%    General appearance: alert; no distress Eyes: PERRLA; EOMI; conjunctiva normal HENT: [place]; AT; with nasal congestion; R TM erythematous and bulging Neck: supple  Lungs: speaks full sentences without difficulty; unlabored Extremities: no edema Skin: warm and dry Neurologic: normal gait Psychological: alert and cooperative; normal mood and affect    No Known Allergies  Past Medical History:  Diagnosis Date  . Eczema   . Environmental allergies    Social History   Socioeconomic History  . Marital status: Single    Spouse name: Not on file  .  Number of children: Not on file  . Years of education: Not on file  . Highest education level: Not on file  Occupational History  . Not on file  Tobacco Use  . Smoking status: Never Smoker  . Smokeless tobacco: Never Used  Substance and Sexual Activity  . Alcohol use: No  . Drug use: No  . Sexual activity: Never    Birth control/protection: None  Other Topics Concern  . Not on file  Social History Narrative  . Not on file   Social Determinants of Health   Financial Resource Strain: Not on file  Food Insecurity: Not on file  Transportation Needs: Not on file  Physical Activity: Not on file  Stress: Not on file  Social Connections: Not on file  Intimate Partner Violence: Not on file   Family History  Problem Relation Age of Onset  . Hyperlipidemia Mother   . Hypertension Mother   . Cancer Other   . Hypertension Other   . Dementia Other    No past surgical history on file.   Mardella Layman, MD 11/26/20 1431

## 2020-12-03 ENCOUNTER — Other Ambulatory Visit: Payer: Self-pay

## 2020-12-03 ENCOUNTER — Encounter (HOSPITAL_COMMUNITY): Payer: Self-pay

## 2020-12-03 ENCOUNTER — Ambulatory Visit (HOSPITAL_COMMUNITY)
Admission: EM | Admit: 2020-12-03 | Discharge: 2020-12-03 | Disposition: A | Discharge status: Home / Self Care | Payer: Medicaid Other | Attending: Internal Medicine | Admitting: Internal Medicine

## 2020-12-03 DIAGNOSIS — Z8742 Personal history of other diseases of the female genital tract: Secondary | ICD-10-CM | POA: Insufficient documentation

## 2020-12-03 DIAGNOSIS — N939 Abnormal uterine and vaginal bleeding, unspecified: Secondary | ICD-10-CM | POA: Insufficient documentation

## 2020-12-03 DIAGNOSIS — Z3202 Encounter for pregnancy test, result negative: Secondary | ICD-10-CM

## 2020-12-03 LAB — POC URINE PREG, ED: Preg Test, Ur: NEGATIVE

## 2020-12-03 NOTE — ED Triage Notes (Signed)
Pt presents with constant abnormal vaginal spotting (bleeding) since January.

## 2020-12-03 NOTE — ED Provider Notes (Signed)
[facility]    CSN: [id number] Arrival date & time: 12/03/20  1238      History   Chief Complaint Chief Complaint  Patient presents with  . Vaginal Bleeding    HPI Caroline Scott is a 25 y.o. female who presents with constant vaginal spotting since Jan 2021. 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 She called [facility] to let them know, but the nurse said someone will call her and has not heard from them. Denies having different partner before Jan when she had neg STD testing.  LMP 4/27, but had sex yesterday and today she is passing clots.  March period was 4/23 She denies use of birth control. Her partner used a condom yesterday.    Past Medical History:  Diagnosis Date  . Eczema   . Environmental allergies     There are no problems to display for this patient.   History reviewed. No pertinent surgical history.  OB History    Gravida  0   Para  0   Term  0   Preterm  0   AB  0   Living  0     SAB  0   IAB  0   Ectopic  0   Multiple  0   Live Births  0            Home Medications    Prior to Admission medications   Not on File    Family History Family History  Problem Relation Age of Onset  . Hyperlipidemia Mother   . Hypertension Mother   . Cancer Other   . Hypertension Other   . Dementia Other     Social History Social History   Tobacco Use  . Smoking status: Never Smoker  . Smokeless tobacco: Never Used  Substance Use Topics  . Alcohol use: No  . Drug use: No     Allergies   Patient has no known allergies.   Review of Systems Review of Systems  Constitutional: Negative for unexpected weight change.       Has lost wt due to decreased appetite in the past year. Has lost 49 lbs in the past year  Endocrine: Negative for cold intolerance and heat intolerance.  Genitourinary: Positive for pelvic pain. Negative for dysuria and frequency.   Psychiatric/Behavioral: Positive for sleep disturbance.   Physical Exam Triage Vital Signs ED Triage Vitals  Enc Vitals Group     BP 12/03/20 1319 129/79     Pulse Rate 12/03/20 1319 71     Resp 12/03/20 1319 18     Temp 12/03/20 1319 98.3 F (36.8 C)     Temp Source 12/03/20 1319 Oral     SpO2 12/03/20 1319 98 %     Weight --      Height --      Head Circumference --      Peak Flow --      Pain Score 12/03/20 1321 0     Pain Loc --      Pain Edu? --      Excl. in GC? --    No data found.  Updated Vital Signs BP 129/79 (BP Location: Right Arm)   Pulse 71   Temp 98.3 F (36.8 C) (Oral)   Resp 18   LMP 11/20/2020   SpO2 98%   Visual Acuity Right Eye Distance:   Left Eye Distance:  Bilateral Distance:    Right Eye Near:   Left Eye Near:    Bilateral Near:     Physical Exam Vitals and nursing note reviewed.  Constitutional:      General: She is not in acute distress.    Appearance: She is obese. She is not toxic-appearing.  HENT:     Head: Normocephalic.     Right Ear: External ear normal.     Left Ear: External ear normal.  Eyes:     General: No scleral icterus.    Conjunctiva/sclera: Conjunctivae normal.  Pulmonary:     Effort: Pulmonary effort is normal.  Abdominal:     Palpations: There is no mass.     Tenderness: There is no abdominal tenderness. There is no guarding.  Musculoskeletal:        General: Normal range of motion.     Cervical back: Neck supple.  Skin:    General: Skin is warm and dry.  Neurological:     Mental Status: She is alert and oriented to person, place, and time.     Gait: Gait normal.  Psychiatric:        Mood and Affect: Mood normal.        Behavior: Behavior normal.        Thought Content: Thought content normal.        Judgment: Judgment normal.     UC Treatments / Results  Labs (all labs ordered are listed, but only abnormal results are displayed) Labs Reviewed  POC URINE PREG, ED  CERVICOVAGINAL ANCILLARY  ONLY  Pregnancy test is neg  EKG   Radiology No results found.  Procedures Procedures (including critical care time)  Medications Ordered in UC Medications - No data to display  Initial Impression / Assessment and Plan / UC Course  I have reviewed the triage vital signs and the nursing notes. Pertinent labs results that were available during my care of the patient were reviewed by me and considered in my medical decision making (see chart for details). Has abnormal Uterine bleeding. STD needs to be ruled out. 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   Final Clinical Impressions(s) / UC Diagnoses   Final diagnoses:  Abnormal vaginal bleeding  History of abnormal cervical Pap smear     Discharge Instructions     Make an appointment to see a GYN as soon as you can.     ED Prescriptions    None     PDMP not reviewed this encounter.   Garey Ham, PA-C 12/03/20 1601

## 2020-12-03 NOTE — Discharge Instructions (Addendum)
Make an appointment to see a GYN as soon as you can.

## 2020-12-04 ENCOUNTER — Telehealth (HOSPITAL_COMMUNITY): Payer: Self-pay | Admitting: Emergency Medicine

## 2020-12-04 LAB — CERVICOVAGINAL ANCILLARY ONLY
Bacterial Vaginitis (gardnerella): POSITIVE — AB
Chlamydia: NEGATIVE
Comment: NEGATIVE
Comment: NEGATIVE
Comment: NEGATIVE
Comment: NORMAL
Neisseria Gonorrhea: NEGATIVE
Trichomonas: NEGATIVE

## 2020-12-08 ENCOUNTER — Emergency Department (HOSPITAL_COMMUNITY): Payer: Self-pay

## 2020-12-08 ENCOUNTER — Emergency Department (HOSPITAL_COMMUNITY)
Admission: EM | Admit: 2020-12-08 | Discharge: 2020-12-08 | Disposition: A | Discharge status: Home / Self Care | Payer: Self-pay | Attending: Emergency Medicine | Admitting: Emergency Medicine

## 2020-12-08 ENCOUNTER — Other Ambulatory Visit: Payer: Self-pay

## 2020-12-08 DIAGNOSIS — S5001XA Contusion of right elbow, initial encounter: Secondary | ICD-10-CM | POA: Insufficient documentation

## 2020-12-08 DIAGNOSIS — S5012XA Contusion of left forearm, initial encounter: Secondary | ICD-10-CM | POA: Insufficient documentation

## 2020-12-08 DIAGNOSIS — Z23 Encounter for immunization: Secondary | ICD-10-CM | POA: Insufficient documentation

## 2020-12-08 DIAGNOSIS — M79675 Pain in left toe(s): Secondary | ICD-10-CM | POA: Insufficient documentation

## 2020-12-08 MED ORDER — TETANUS-DIPHTH-ACELL PERTUSSIS 5-2.5-18.5 LF-MCG/0.5 IM SUSY
0.5000 mL | PREFILLED_SYRINGE | Freq: Once | INTRAMUSCULAR | Status: AC
  Administered 2020-12-08: 0.5 mL via INTRAMUSCULAR
  Filled 2020-12-08: qty 0.5

## 2020-12-08 MED ORDER — AMOXICILLIN-POT CLAVULANATE 875-125 MG PO TABS
1.0000 | ORAL_TABLET | Freq: Two times a day (BID) | ORAL | 0 refills | Status: DC
Start: 2020-12-08 — End: 2021-04-29

## 2020-12-08 NOTE — ED Notes (Signed)
Pt discharged and ambulated out of the ED without difficulty. 
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
## 2020-12-08 NOTE — Discharge Instructions (Signed)
Your x-ray showed no hand fracture Take the augmentin for 7 days, twice a day, as prescribed Come back for any signs of wound infection; redness, swelling, pus from the wound

## 2020-12-24 ENCOUNTER — Other Ambulatory Visit: Payer: Self-pay

## 2020-12-24 ENCOUNTER — Other Ambulatory Visit (HOSPITAL_COMMUNITY)
Admission: RE | Admit: 2020-12-24 | Discharge: 2020-12-24 | Disposition: A | Discharge status: Home / Self Care | Payer: Medicaid Other | Source: Ambulatory Visit | Attending: Certified Nurse Midwife | Admitting: Certified Nurse Midwife

## 2020-12-24 ENCOUNTER — Encounter: Payer: Self-pay | Admitting: Family Medicine

## 2020-12-24 ENCOUNTER — Ambulatory Visit (INDEPENDENT_AMBULATORY_CARE_PROVIDER_SITE_OTHER): Payer: Self-pay | Admitting: Certified Nurse Midwife

## 2020-12-24 VITALS — BP 117/81 | HR 93 | Wt 162.1 lb

## 2020-12-24 DIAGNOSIS — N898 Other specified noninflammatory disorders of vagina: Secondary | ICD-10-CM

## 2020-12-24 NOTE — Patient Instructions (Signed)
Start taking one evening primrose oil capsule daily (stop if discharge increase becomes bothersome)  Start taking a daily probiotic.  If discharge is not bothersome, smelly, or causing discomfort - you do not need to treat BV.

## 2020-12-24 NOTE — Progress Notes (Signed)
Patient is here today because she has daily vaginal bleeding. Stated that she had an abnormal pap smear back in January of this year and has been spotting ever since then

## 2020-12-24 NOTE — Progress Notes (Signed)
History:  [name] is a 25 y.o. G0P0000 who presents to clinic today for daily vaginal bleeding/spotting since her pap test in January. 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 Also told to work on "immune boosting" to help her body clear the virus. Since her pap, she has been spotting on a regular basis (at least a little bit every day). Also been treated off and on for years for BV despite always being asymptomatic when it is found during routine testing. Has been on flagyl multiple times and clindamycin. BV always comes back (asymptomatic) and she often gets yeast infections and spotting after antibiotic treatment. Wants to know if she has to keep treating the BV. No other physical complaints.  The following portions of the patient's history were reviewed and updated as appropriate: allergies, current medications, family history, past medical history, social history, past surgical history and problem list.  Review of Systems:  Pertinent items noted in HPI and remainder of comprehensive ROS otherwise negative.   Objective:  Physical Exam BP 117/81   Pulse 93   Wt 162 lb 1.6 oz (73.5 kg)   LMP 12/17/2020   BMI 28.71 kg/m  Physical Exam Vitals and nursing note reviewed.  Constitutional:      Appearance: Normal appearance.  HENT:     Head: Normocephalic.  Eyes:     Pupils: Pupils are equal, round, and reactive to light.  Cardiovascular:     Rate and Rhythm: Normal rate.  Pulmonary:     Effort: Pulmonary effort is normal.  Genitourinary:    General: Normal vulva.     Vagina: No vaginal discharge (white physiologic discharge).     Comments: Pink ruggae, cervix clean, no friability or bleeding Neurological:     Mental Status: She is alert.    Labs and Imaging Swab collected  Assessment & Plan:  1. Vaginal discharge - Cervicovaginal ancillary only( [facility]) - Lengthy discussion on evidence supporting not  treating asymptomatic BV, noting the side effects she is experiencing.  - Suggested taking a daily probiotic as well as evening primrose oil daily for 2-4wks and stopping the antibiotics for BV. Pt amenable to plan and expressed relief and not being on constant antibiotics.  Follow up in January for annual gyn including pap or prn.  Bernerd Limbo, [place] 12/24/2020 8:59 PM

## 2020-12-25 LAB — CERVICOVAGINAL ANCILLARY ONLY
Bacterial Vaginitis (gardnerella): POSITIVE — AB
Candida Glabrata: NEGATIVE
Candida Vaginitis: NEGATIVE
Chlamydia: NEGATIVE
Comment: NEGATIVE
Comment: NEGATIVE
Comment: NEGATIVE
Comment: NEGATIVE
Comment: NEGATIVE
Comment: NORMAL
Neisseria Gonorrhea: NEGATIVE
Trichomonas: NEGATIVE

## 2021-04-29 ENCOUNTER — Other Ambulatory Visit: Payer: Self-pay

## 2021-04-29 ENCOUNTER — Ambulatory Visit (HOSPITAL_COMMUNITY)
Admission: EM | Admit: 2021-04-29 | Discharge: 2021-04-29 | Disposition: A | Discharge status: Home / Self Care | Payer: Self-pay | Attending: Internal Medicine | Admitting: Internal Medicine

## 2021-04-29 ENCOUNTER — Encounter (HOSPITAL_COMMUNITY): Payer: Self-pay

## 2021-04-29 ENCOUNTER — Ambulatory Visit (INDEPENDENT_AMBULATORY_CARE_PROVIDER_SITE_OTHER): Payer: Self-pay

## 2021-04-29 DIAGNOSIS — S61305A Unspecified open wound of left ring finger with damage to nail, initial encounter: Secondary | ICD-10-CM

## 2021-04-29 DIAGNOSIS — S8012XA Contusion of left lower leg, initial encounter: Secondary | ICD-10-CM

## 2021-04-29 DIAGNOSIS — S5001XA Contusion of right elbow, initial encounter: Secondary | ICD-10-CM

## 2021-04-29 DIAGNOSIS — S61309A Unspecified open wound of unspecified finger with damage to nail, initial encounter: Secondary | ICD-10-CM
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
## 2021-04-29 NOTE — Discharge Instructions (Signed)
Your x-rays were all normal and did not show any fracture.  You will likely be very sore for quite some time, you can take Tylenol or ibuprofen as needed for the pain.  Heat can also be helpful for you as well as ice.  If you are not improving over the next few weeks, I recommend follow-up with your regular provider.  As we discussed, you do not currently have signs of concussion, but it is possible you could experience headaches, fogginess, difficulty concentrating for a few weeks if he did have a concussion.  This can last up to 4 weeks.  If you feel confused at any point, you have numbness and tingling on one side of your body or the other or weakness on one side of your body or the other, worsening headache that is not improving with Tylenol, you should be seen at the emergency room right away.

## 2021-05-20 ENCOUNTER — Other Ambulatory Visit: Payer: Self-pay

## 2021-05-20 ENCOUNTER — Ambulatory Visit (HOSPITAL_COMMUNITY)
Admission: EM | Admit: 2021-05-20 | Discharge: 2021-05-20 | Disposition: A | Discharge status: Home / Self Care | Payer: Medicaid Other

## 2021-05-20 ENCOUNTER — Encounter (HOSPITAL_COMMUNITY): Payer: Self-pay

## 2021-05-20 DIAGNOSIS — S8002XA Contusion of left knee, initial encounter: Secondary | ICD-10-CM
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
## 2021-05-20 NOTE — Discharge Instructions (Signed)
Follow up with [facility] as soon as possible for re-evaluation.   Rest as much as possible Ice for 10-15 minutes every 4-6 hours as needed for pain and swelling Elevate above your hip/heart when sitting and laying down

## 2021-08-02 DIAGNOSIS — R87610 Atypical squamous cells of undetermined significance on cytologic smear of cervix (ASC-US): Secondary | ICD-10-CM

## 2021-08-02 HISTORY — DX: Atypical squamous cells of undetermined significance on cytologic smear of cervix (ASC-US): R87.610

## 2021-09-15 ENCOUNTER — Telehealth: Payer: Self-pay

## 2021-09-15 NOTE — Telephone Encounter (Signed)
Telephoned patient at mobile number. Left a voice message with [facility] scheduling information. 

## 2021-10-28 IMAGING — DX DG HAND 2V*L*
2 series · 2 of 2 positions shown · non-contrast
Comparison: None.

CLINICAL DATA: Left hand pain.  No known injury.

EXAM:
LEFT HAND - 2 VIEW

[x hand pa left]
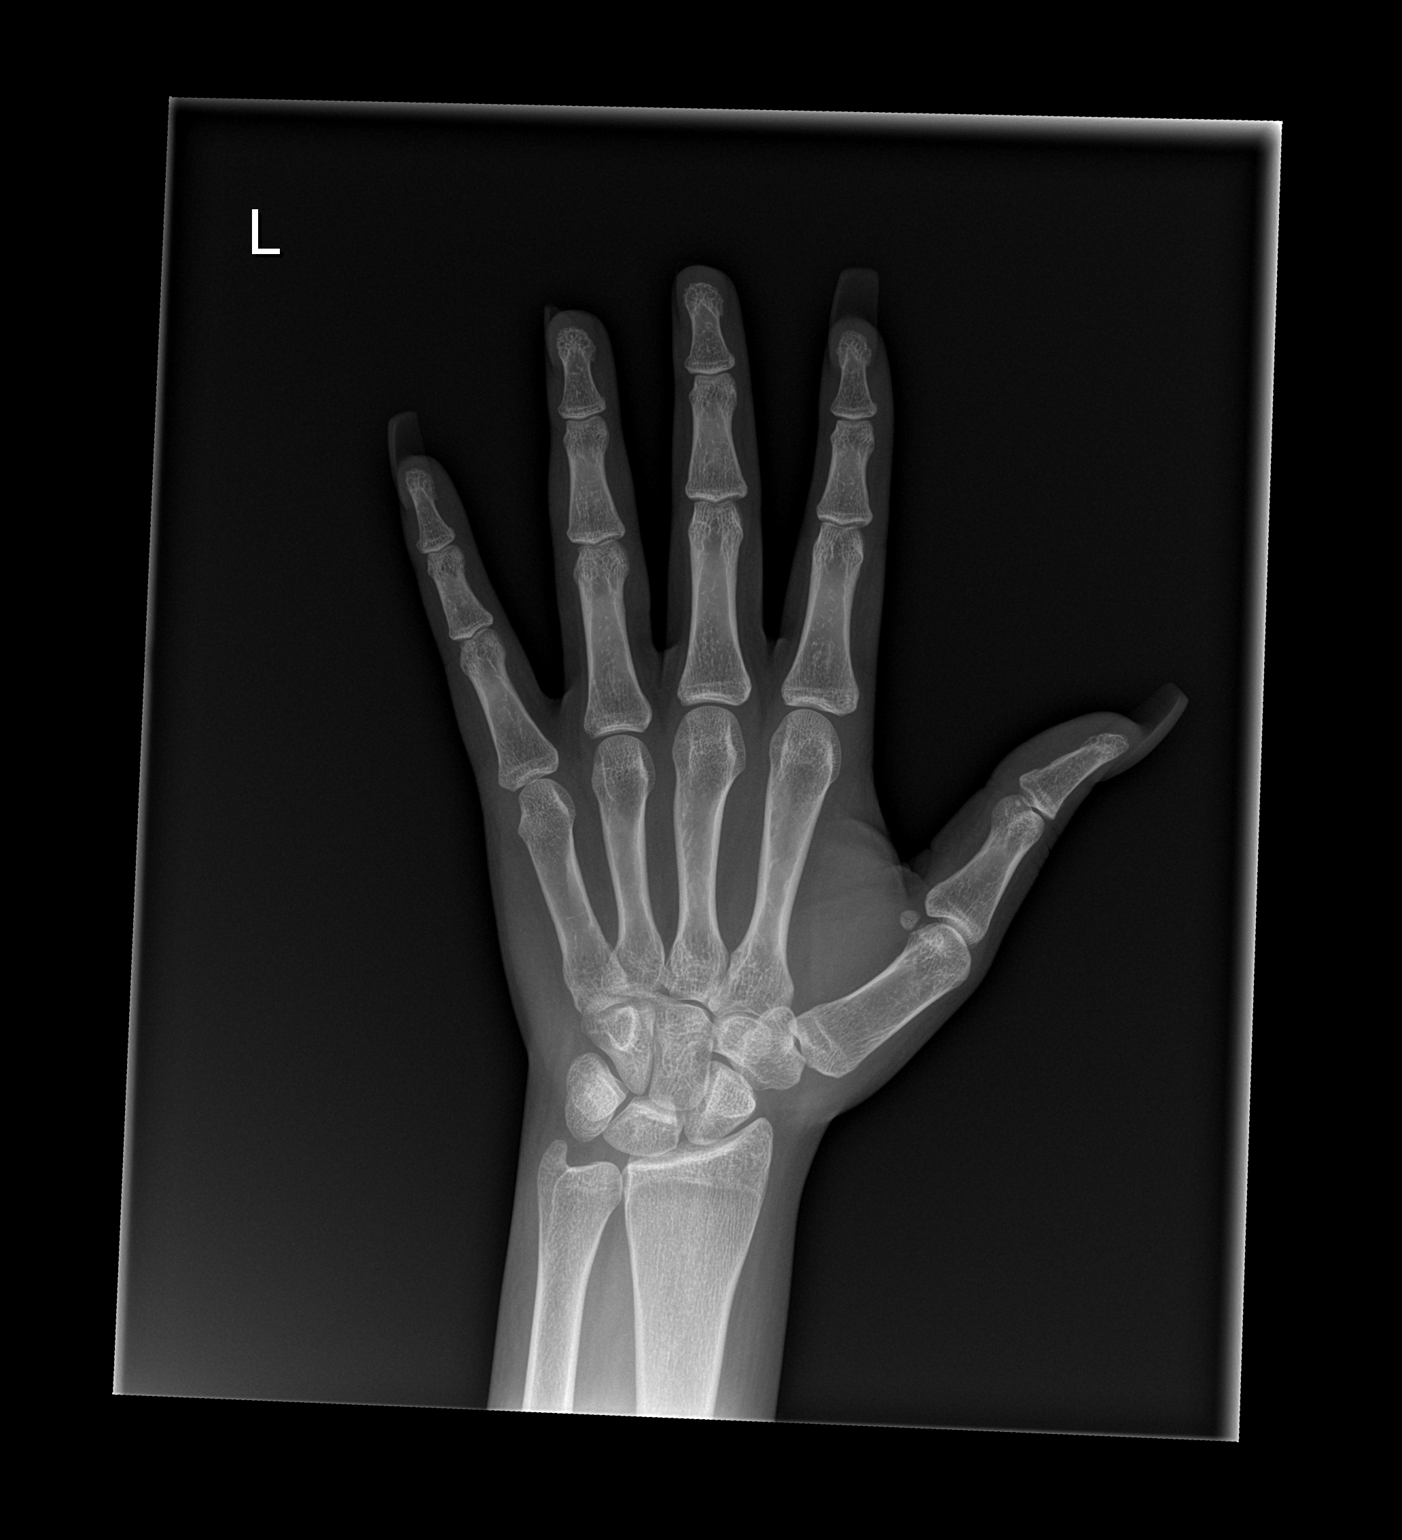

[x hand lat left]
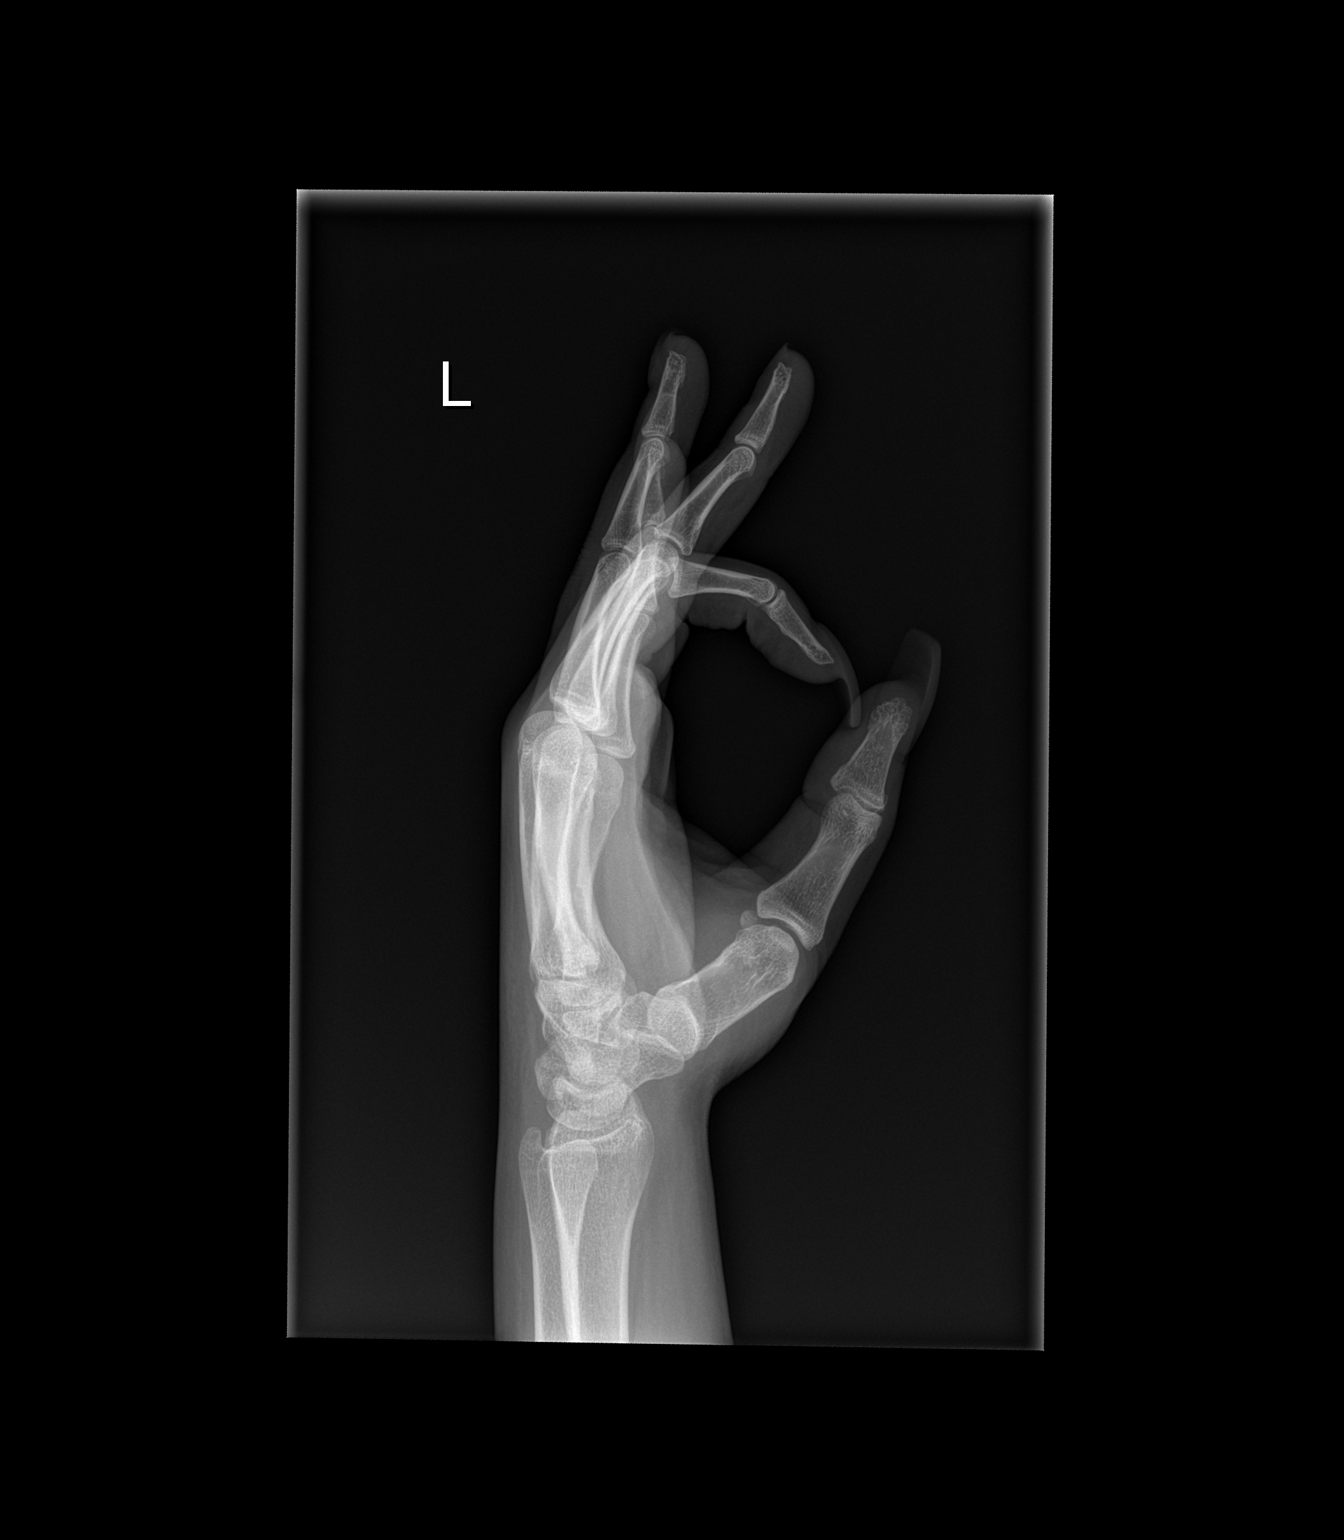

[2 of 2 positions shown; findings below may reference images not displayed]

FINDINGS: There is no evidence of fracture or dislocation. There is no
evidence of arthropathy or other focal bone abnormality. Soft
tissues are unremarkable.
IMPRESSION: Normal exam.

## 2021-10-31 HISTORY — PX: COLPOSCOPY: SHX161

## 2021-11-12 ENCOUNTER — Ambulatory Visit: Payer: Self-pay | Admitting: *Deleted

## 2021-11-12 ENCOUNTER — Ambulatory Visit: Payer: Medicaid Other | Admitting: Obstetrics and Gynecology

## 2021-11-12 VITALS — BP 114/72

## 2021-11-12 DIAGNOSIS — Z1239 Encounter for other screening for malignant neoplasm of breast: Secondary | ICD-10-CM

## 2021-11-12 DIAGNOSIS — R87810 Cervical high risk human papillomavirus (HPV) DNA test positive: Secondary | ICD-10-CM

## 2021-11-12 NOTE — Progress Notes (Signed)
[name] is a 26 y.o. female who presents to [facility] clinic today with no complaints. Patient referred to [facility] due to having an abnormal Pap smear 08/28/2021 that a colposcopy is recommended for follow up. ?  ?Pap Smear: Pap smear not completed today. Last Pap smear was 08/28/2021 at the [facility] clinic and was abnormal - ASCUS with positive HPV . Per patient has history of one other abnormal Pap smear 08/13/2020 that was ASCUS with positive HPV that a repeat Pap smear in one year was recommended for follow up. Last Pap smear result is available in Epic. ?  ?Physical exam: ?Breasts ?Breasts symmetrical. No skin abnormalities bilateral breasts. No nipple retraction bilateral breasts. No nipple discharge bilateral breasts. No lymphadenopathy. No lumps palpated bilateral breasts. No complaints of pain or tenderness on exam. Screening mammogram recommended at age 69 unless clinically indicated prior.    ?  ?Pelvic/Bimanual ?Pap is not indicated today per [facility] guidelines. ?  ?Smoking History: ?Patient is a former smoker that quit in 2022. ?  ?Patient Navigation: ?Patient education provided. Access to services provided for patient through [facility] program.  ?  ?Breast and Cervical Cancer Risk Assessment: ?Patient has family history of her maternal grandmother having breast cancer. Patient has no known genetic mutations or history of radiation treatment to the chest before age 78. Patient does not have history of cervical dysplasia, immunocompromised, or DES exposure in-utero. Breast cancer risk assessment completed. No breast cancer risk calculated due to patient is less than 31 years old. ? ?Risk Assessment   ? ? Risk Scores   ? ?   11/12/2021  ? Last edited by: Narda Rutherford, LPN  ? 5-year risk:   ? Lifetime risk:   ? ?  ?  ? ?  ? ? ?A: ?[facility] exam without pap smear ?No complaints. ? ?P: ?Referred patient to the [facility] for a colposcopy to follow up for  her abnormal Pap smear. Appointment scheduled Thursday, November 26, 2021 at 1055. ? ?Priscille Heidelberg, RN ?11/12/2021 2:51 PM   ?

## 2021-11-12 NOTE — Patient Instructions (Signed)
Explained breast self awareness with Caroline Scott. Patient did not need a Pap smear today due to last Pap smear was 08/28/2021. Explained the colposcopy the recommended follow up for her abnormal Pap smear. Referred patient to the [facility] for a colposcopy to follow up for her abnormal Pap smear. Appointment scheduled Thursday, November 26, 2021 at 1055. Patient aware of appointment and will be there. Let patient know a screening mammogram is recommended at age 26 unless clinically indicated prior. Caroline Scott verbalized understanding. ? ?Noreta Kue, Kathaleen Maser, RN ?2:51 PM ? ? ? ? ?

## 2021-11-26 ENCOUNTER — Ambulatory Visit: Payer: Medicaid Other | Admitting: Medical

## 2021-11-26 ENCOUNTER — Other Ambulatory Visit (HOSPITAL_COMMUNITY)
Admission: RE | Admit: 2021-11-26 | Discharge: 2021-11-26 | Disposition: A | Discharge status: Home / Self Care | Payer: Medicaid Other | Source: Ambulatory Visit | Attending: Obstetrics and Gynecology | Admitting: Obstetrics and Gynecology

## 2021-11-26 ENCOUNTER — Encounter: Payer: Self-pay | Admitting: Medical

## 2021-11-26 ENCOUNTER — Ambulatory Visit (INDEPENDENT_AMBULATORY_CARE_PROVIDER_SITE_OTHER): Payer: Medicaid Other | Admitting: Medical

## 2021-11-26 ENCOUNTER — Ambulatory Visit: Payer: Self-pay | Admitting: Medical

## 2021-11-26 VITALS — BP 126/64 | HR 77 | Wt 168.0 lb

## 2021-11-26 DIAGNOSIS — R87810 Cervical high risk human papillomavirus (HPV) DNA test positive: Secondary | ICD-10-CM | POA: Diagnosis present

## 2021-11-26 DIAGNOSIS — R87610 Atypical squamous cells of undetermined significance on cytologic smear of cervix (ASC-US): Secondary | ICD-10-CM | POA: Diagnosis present

## 2021-11-26 LAB — POCT PREGNANCY, URINE: Preg Test, Ur: NEGATIVE

## 2021-11-26 NOTE — Progress Notes (Signed)
? ? ? ? ?  GYNECOLOGY CLINIC COLPOSCOPY PROCEDURE NOTE ? ?[name] is a 26 y.o. [id number] here for colposcopy for ASCUS with POSITIVE high risk HPV pap smear on 08/28/21. Discussed role for HPV in cervical dysplasia, need for surveillance. ? ?Patient given informed consent, signed copy in the chart, time out was performed.  Placed in lithotomy position. Cervix viewed with speculum and colposcope after application of acetic acid.  ? ?Colposcopy adequate? Yes ? no mosaicism, no punctation, no abnormal vasculature, and acetowhite lesion(s) noted at 12 o'clock; biopsies obtained.  ECC specimen obtained. ?All specimens were labelled and sent to pathology. ? ?Patient was given post procedure instructions.  Will follow up pathology and manage accordingly.  Routine preventative health maintenance measures emphasized. ? ? ?Marny Lowenstein, PA-C ?11/26/2021 2:17 PM  ? ?

## 2021-11-26 NOTE — Progress Notes (Signed)
UPT -NEGATIVE

## 2021-11-30 LAB — SURGICAL PATHOLOGY

## 2021-12-01 ENCOUNTER — Telehealth: Payer: Self-pay | Admitting: Family Medicine

## 2021-12-01 NOTE — Telephone Encounter (Signed)
Patient called in stating she has questions regarding her colpo she had last week and they are urgent questions about things she is experiencing.  ?

## 2021-12-01 NOTE — Telephone Encounter (Signed)
Returned pt's call and pt stated that she is having black discharge with an odor after having a colpo.  I explained to the pt that is normal for the black discharge as it comes from a solution that was used to stop the bleeding.  I advised pt to schedule a nurse visit for a self swab for vaginal odor as it could be BV with her hx as well.   Pt scheduled for nurse visit on 12/03/21 @ 0920.   ? ?Latika Kronick,RN  ?12/01/21 ?

## 2021-12-03 ENCOUNTER — Ambulatory Visit (INDEPENDENT_AMBULATORY_CARE_PROVIDER_SITE_OTHER): Payer: Medicaid Other

## 2021-12-03 ENCOUNTER — Ambulatory Visit: Payer: Self-pay

## 2021-12-03 ENCOUNTER — Other Ambulatory Visit (HOSPITAL_COMMUNITY)
Admission: RE | Admit: 2021-12-03 | Discharge: 2021-12-03 | Disposition: A | Discharge status: Home / Self Care | Payer: Medicaid Other | Source: Ambulatory Visit | Attending: Family Medicine | Admitting: Family Medicine

## 2021-12-03 VITALS — BP 132/84 | HR 73 | Wt 167.6 lb

## 2021-12-03 DIAGNOSIS — N898 Other specified noninflammatory disorders of vagina: Secondary | ICD-10-CM | POA: Insufficient documentation

## 2021-12-03 NOTE — Progress Notes (Signed)
Patient here today with complaint of vaginal odor. Patient requesting to do a self swab. I instructed patient on how to collect self swab. Swab collected without issue. I informed patient we will notify her with any abnormal results. Patient verbalized understanding and denies any other questions.  ? ?Alesia Richards, RN ?12/03/21 ?

## 2021-12-04 LAB — CERVICOVAGINAL ANCILLARY ONLY
Bacterial Vaginitis (gardnerella): POSITIVE — AB
Candida Glabrata: NEGATIVE
Candida Vaginitis: NEGATIVE
Chlamydia: NEGATIVE
Comment: NEGATIVE
Comment: NEGATIVE
Comment: NEGATIVE
Comment: NEGATIVE
Comment: NEGATIVE
Comment: NORMAL
Neisseria Gonorrhea: NEGATIVE
Trichomonas: NEGATIVE

## 2021-12-10 ENCOUNTER — Telehealth: Payer: Self-pay | Admitting: Lactation Services

## 2021-12-10 ENCOUNTER — Telehealth: Payer: Self-pay | Admitting: Family Medicine

## 2021-12-10 MED ORDER — METRONIDAZOLE 500 MG PO TABS: 500.0000 mg | ORAL_TABLET | Freq: Two times a day (BID) | ORAL | 0 refills | Status: DC

## 2021-12-10 NOTE — Telephone Encounter (Signed)
-----   Message from [place], [place] sent at 12/09/2021 11:35 AM EDT ----- ?Hi! This patient has BV-Can you all send oral flagyl in for this patient and notify her? Thank you so much!  ?

## 2021-12-10 NOTE — Telephone Encounter (Signed)
Calling for test results ?

## 2021-12-10 NOTE — Telephone Encounter (Signed)
Called patient and she states someone else has already returned her phone call & answered her question. Patient had no questions ?

## 2021-12-10 NOTE — Telephone Encounter (Signed)
Called and spoke with patient. She was informed of + BV and will send Flagyl for treatment. Reviewed to take with food and to not drink alcohol while taking as can make her ill. Patient voiced understanding and has no questions or concerns at this time.  ?

## 2022-06-07 ENCOUNTER — Telehealth: Payer: Self-pay

## 2022-06-07 NOTE — Telephone Encounter (Signed)
Patient called with new gyn concerns. Patient has been feeling pressure in her pelvic area and spotting between periods. Explained to patient that we cannot address these concerns in [facility] she will need to be referred back to [facility] for follow-up. Informed patient that [facility] will not be able to cover FU. Will mail financial assistance application to patient to complete. Patient voiced understanding.

## 2022-10-11 ENCOUNTER — Encounter (HOSPITAL_COMMUNITY): Payer: Self-pay

## 2022-10-11 ENCOUNTER — Ambulatory Visit (HOSPITAL_COMMUNITY)
Admission: EM | Admit: 2022-10-11 | Discharge: 2022-10-11 | Disposition: A | Discharge status: Home / Self Care | Payer: Medicaid Other | Attending: Family Medicine | Admitting: Family Medicine

## 2022-10-11 DIAGNOSIS — J069 Acute upper respiratory infection, unspecified: Secondary | ICD-10-CM

## 2022-10-11 NOTE — Discharge Instructions (Signed)
You were seen today for upper respiratory infection.  This appear viral.  I recommend over the counter claritin/zyrtec for congestion/drainage.  Rest and fluids as well.

## 2022-10-11 NOTE — ED Triage Notes (Addendum)
Pt is here for headaches , nasal congestion and runny nose x 1day . Pt has tried OTC meds but, forget the name of medication.

## 2022-10-11 NOTE — ED Provider Notes (Signed)
[facility]    CSN: [id number] Arrival date & time: 10/11/22  1021      History   Chief Complaint Chief Complaint  Patient presents with   Nasal Congestion    HPI Caroline Scott is a 27 y.o. female.   Patient is here for URI symptoms since yesterday.  Runny nose, mild runny nose.  Ears felt slightly full.  No cough.  No fevers/chills.  Today she sneezing up a jelly like mucous with blood in it.  She did take dayquil today.  No sick contacts.        Past Medical History:  Diagnosis Date   Eczema    Environmental allergies     Patient Active Problem List   Diagnosis Date Noted   ASCUS with positive high risk HPV cervical 11/26/2021    History reviewed. No pertinent surgical history.  OB History     Gravida  0   Para  0   Term  0   Preterm  0   AB  0   Living  0      SAB  0   IAB  0   Ectopic  0   Multiple  0   Live Births  0            Home Medications    Prior to Admission medications   Medication Sig Start Date End Date Taking? Authorizing Provider  metroNIDAZOLE (FLAGYL) 500 MG tablet Take 1 tablet (500 mg total) by mouth 2 (two) times daily. 12/10/21   Starr Lake, CNM    Family History Family History  Problem Relation Age of Onset   Hyperlipidemia Mother    Hypertension Mother    Breast cancer Maternal Grandmother    Dementia Maternal Grandmother    Cancer Other    Hypertension Other    Dementia Other     Social History Social History   Tobacco Use   Smoking status: Former    Types: Cigarettes    Quit date: 2022    Years since quitting: 2.1   Smokeless tobacco: Never  Vaping Use   Vaping Use: Never used  Substance Use Topics   Alcohol use: Yes   Drug use: Never     Allergies   Patient has no known allergies.   Review of Systems Review of Systems  Constitutional: Negative.   HENT:  Positive for congestion and rhinorrhea. Negative for sore throat.   Respiratory:  Negative.    Cardiovascular: Negative.   Gastrointestinal: Negative.   Genitourinary: Negative.   Musculoskeletal: Negative.   Psychiatric/Behavioral: Negative.       Physical Exam Triage Vital Signs ED Triage Vitals  Enc Vitals Group     BP 10/11/22 1104 132/87     Pulse Rate 10/11/22 1104 76     Resp 10/11/22 1104 12     Temp 10/11/22 1104 98.5 F (36.9 C)     Temp Source 10/11/22 1104 Oral     SpO2 10/11/22 1104 97 %     Weight --      Height --      Head Circumference --      Peak Flow --      Pain Score 10/11/22 1101 0     Pain Loc --      Pain Edu? --      Excl. in [place]? --    No data found.  Updated Vital Signs BP 132/87 (BP Location: Left Arm)   Pulse 76  Temp 98.5 F (36.9 C) (Oral)   Resp 12   LMP 10/09/2022   SpO2 97%   Visual Acuity Right Eye Distance:   Left Eye Distance:   Bilateral Distance:    Right Eye Near:   Left Eye Near:    Bilateral Near:     Physical Exam Constitutional:      Appearance: Normal appearance.  HENT:     Right Ear: Tympanic membrane normal.     Left Ear: Tympanic membrane normal.     Nose: Congestion and rhinorrhea present.     Mouth/Throat:     Mouth: Mucous membranes are moist.     Pharynx: No oropharyngeal exudate or posterior oropharyngeal erythema.  Cardiovascular:     Rate and Rhythm: Normal rate and regular rhythm.  Pulmonary:     Effort: Pulmonary effort is normal.     Breath sounds: Normal breath sounds.  Musculoskeletal:     Cervical back: Normal range of motion and neck supple. No tenderness.  Lymphadenopathy:     Cervical: No cervical adenopathy.  Skin:    General: Skin is warm.  Neurological:     General: No focal deficit present.     Mental Status: She is alert.      UC Treatments / Results  Labs (all labs ordered are listed, but only abnormal results are displayed) Labs Reviewed - No data to display  EKG   Radiology No results found.  Procedures Procedures (including critical  care time)  Medications Ordered in UC Medications - No data to display  Initial Impression / Assessment and Plan / UC Course  I have reviewed the triage vital signs and the nursing notes.  Pertinent labs & imaging results that were available during my care of the patient were reviewed by me and considered in my medical decision making (see chart for details).   Final Clinical Impressions(s) / UC Diagnoses   Final diagnoses:  Upper respiratory tract infection, unspecified type     Discharge Instructions      You were seen today for upper respiratory infection.  This appear viral.  I recommend over the counter claritin/zyrtec for congestion/drainage.  Rest and fluids as well.     ED Prescriptions   None    PDMP not reviewed this encounter.   Rondel Oh, MD 10/11/22 1125

## 2022-12-02 ENCOUNTER — Encounter: Payer: Self-pay | Admitting: Internal Medicine

## 2022-12-02 ENCOUNTER — Ambulatory Visit: Payer: Medicaid Other | Admitting: Internal Medicine

## 2022-12-02 VITALS — BP 130/82 | HR 90 | Resp 16 | Ht 63.0 in | Wt 177.0 lb

## 2022-12-02 DIAGNOSIS — J302 Other seasonal allergic rhinitis: Secondary | ICD-10-CM

## 2022-12-02 DIAGNOSIS — H0520 Unspecified exophthalmos: Secondary | ICD-10-CM | POA: Diagnosis not present

## 2022-12-02 DIAGNOSIS — R519 Headache, unspecified: Secondary | ICD-10-CM | POA: Diagnosis not present

## 2022-12-02 DIAGNOSIS — Z7689 Persons encountering health services in other specified circumstances: Secondary | ICD-10-CM | POA: Diagnosis not present

## 2022-12-02 MED ORDER — CETIRIZINE HCL 10 MG PO TABS: 10.0000 mg | ORAL_TABLET | Freq: Every day | ORAL | 11 refills | Status: AC

## 2022-12-02 NOTE — Progress Notes (Signed)
SDOH Screenings   Food Insecurity: No Food Insecurity (12/02/2022)  Housing: Low Risk  (12/02/2022)  Transportation Needs: No Transportation Needs (11/12/2021)  Utilities: Not At Risk (12/02/2022)  Depression (PHQ2-9): Low Risk  (12/02/2022)  Tobacco Use: Medium Risk (10/11/2022)       12/02/2022   11:44 AM 11/26/2021    9:39 AM 12/24/2020    4:34 PM 01/04/2017    3:07 PM  GAD 7 : Generalized Anxiety Score  Nervous, Anxious, on Edge 0 0 0 0  Control/stop worrying 0 0 0 0  Worry too much - different things 0 0 0 0  Trouble relaxing 0 0 0 0  Restless 0 0 0 0  Easily annoyed or irritable 0 0 0 0  Afraid - awful might happen 0 0 0 0  Total GAD 7 Score 0 0 0 0  Anxiety Difficulty Not difficult at all        Flowsheet Row Office Visit from 12/02/2022 in [facility]  PHQ-9 Total Score 0     Patient was informed of all the resources that [facility] can provide. Patient does not have any needs. Dr. Delrae Alfred was informed.

## 2022-12-02 NOTE — Progress Notes (Signed)
Subjective:    Patient ID: Caroline Scott, female   DOB: 05/15/1996, 27 y.o.   MRN: [id number]   HPI  Here to establish   Headaches:  Since February, was  waking almost every day with headaches.  Stopped about 2 weeks ago.  Had fleeting burning pain across her forehead and then gone.  She ultimately checked her BP on her mom's home BP monitor and was elevated at times--shows me a 155/93 BP.  She stopped eating Oodles of Noodles and seemed to resolve.    2.  When bends over to tie her shoes and her nose runs--clear.  States since February as well.  + itching of nose, + itchy watery eyes.  No itchy throat or ears.   Did try Claritin 10 mg for 1 day.     No outpatient medications have been marked as taking for the 12/02/22 encounter (Office Visit) with Julieanne Manson, MD.   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   Eczema    Environmental allergies    Past Surgical History:  Procedure Laterality Date   COLPOSCOPY N/A 10/2021   Family History  Problem Relation Age of Onset   Hyperlipidemia Mother    Hypertension Mother    Other Mother        Prediabetes   Cancer Maternal Grandmother    Hypertension Maternal Grandmother    Breast cancer Maternal Grandmother 61   Dementia Maternal Grandmother        Uncertain type--cause of death   Social History   Socioeconomic History   Marital status: Single    Spouse name: Not on file   Number of children: 0   Years of education: Not on file   Highest education level: High school graduate  Occupational History   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  Tobacco Use   Smoking status: Never   Smokeless tobacco: Never  Vaping Use   Vaping Use: Some days   Devices: Hookah once monthly  Substance and Sexual Activity   Alcohol use: Yes    Comment: Once weekly   Drug use: Not Currently    Types: Marijuana     Comment: 2022   Sexual activity: Yes    Birth control/protection: None, Condom  Other Topics Concern   Not on file  Social History Narrative   Lives with her mother, who is also a patient here.    Social Determinants of Health   Financial Resource Strain: Not on file  Food Insecurity: No Food Insecurity (12/02/2022)   Hunger Vital Sign    Worried About Running Out of Food in the Last Year: Never true    Ran Out of Food in the Last Year: Never true  Transportation Needs: No Transportation Needs (11/12/2021)   PRAPARE - Administrator, Civil Service (Medical): No    Lack of Transportation (Non-Medical): No  Physical Activity: Not on file  Stress: Not on file  Social Connections: Not on file  Intimate Partner Violence: Not At Risk (12/02/2022)   Humiliation, Afraid, Rape, and Kick questionnaire    Fear of Current or Ex-Partner: No    Emotionally Abused: No    Physically Abused: No    Sexually Abused: No      Review of Systems    Objective:   BP 130/82 (BP Location: Left Arm, Patient Position: Sitting, Cuff Size: Normal)  Pulse 90   Resp 16   Ht 5\' 3"  (1.6 m)   Wt 177 lb (80.3 kg)   LMP 11/27/2022 (Exact Date)   BMI 31.35 kg/m   Physical Exam Constitutional:      Appearance: She is obese.  HENT:     Head: Normocephalic and atraumatic.     Right Ear: Tympanic membrane, ear canal and external ear normal.     Left Ear: Tympanic membrane, ear canal and external ear normal.     Nose: Congestion and rhinorrhea (clear) present.     Mouth/Throat:     Mouth: Mucous membranes are moist.     Pharynx: Oropharynx is clear.     Comments: No obvious cobbling. Eyes:     Extraocular Movements: Extraocular movements intact.     Conjunctiva/sclera:     Right eye: Right conjunctiva is not injected.     Left eye: Left conjunctiva is not injected.     Pupils: Pupils are equal, round, and reactive to light.     Comments: Discs sharp. Perhaps mild exophthalmos vs normal  anatomy  Neck:     Thyroid: No thyroid mass or thyromegaly.  Cardiovascular:     Rate and Rhythm: Normal rate and regular rhythm.     Pulses: Normal pulses.     Heart sounds: S1 normal and S2 normal. No murmur heard.    No friction rub. No S3 or S4 sounds.  Pulmonary:     Effort: Pulmonary effort is normal.     Breath sounds: Normal breath sounds and air entry.  Abdominal:     General: Bowel sounds are normal.     Palpations: Abdomen is soft. There is no hepatomegaly, splenomegaly or mass.     Tenderness: There is no abdominal tenderness.  Musculoskeletal:     Cervical back: Normal range of motion and neck supple.     Right lower leg: No edema.     Left lower leg: No edema.  Lymphadenopathy:     Head:     Right side of head: No submental or submandibular adenopathy.     Left side of head: No submental or submandibular adenopathy.     Cervical: No cervical adenopathy.  Skin:    General: Skin is warm.     Capillary Refill: Capillary refill takes less than 2 seconds.     Findings: No rash.  Neurological:     General: No focal deficit present.     Mental Status: She is alert and oriented to person, place, and time.     Cranial Nerves: Cranial nerves 2-12 are intact.     Sensory: Sensation is intact.     Motor: Motor function is intact.     Coordination: Coordination is intact.     Gait: Gait is intact.     Deep Tendon Reflexes: Reflexes are normal and symmetric.  Psychiatric:        Speech: Speech normal.        Behavior: Behavior normal. Behavior is cooperative.      Assessment & Plan   Fleeting headaches:  resolved when stopped eating Ramen Oodles of Noodles.  Patient with concern for elevated BP at times.  She will bring in her mom's home monitor to compare to our manual one and see if give reliable reading.  CBC, CMP  2. Obesity and family history of prediabetes, htn:  encouraged her to make lifestyle changes with nutrition and physical activity to avoid as ages.  FLP,  CMP  3.  ?  prominent eyes:  not clear if normal variant.  TSH.    4.  Allergies:  Cetirizine 10 mg daily.  Consider addition of Flonase if does not improve.

## 2022-12-03 LAB — CBC WITH DIFFERENTIAL/PLATELET
Basophils Absolute: 0 10*3/uL (ref 0.0–0.2)
Basos: 1 %
EOS (ABSOLUTE): 0.4 10*3/uL (ref 0.0–0.4)
Eos: 7 %
Hematocrit: 38.6 % (ref 34.0–46.6)
Hemoglobin: 12.6 g/dL (ref 11.1–15.9)
Immature Grans (Abs): 0 10*3/uL (ref 0.0–0.1)
Immature Granulocytes: 0 %
Lymphocytes Absolute: 2.7 10*3/uL (ref 0.7–3.1)
Lymphs: 49 %
MCH: 30.9 pg (ref 26.6–33.0)
MCHC: 32.6 g/dL (ref 31.5–35.7)
MCV: 95 fL (ref 79–97)
Monocytes Absolute: 0.5 10*3/uL (ref 0.1–0.9)
Monocytes: 9 %
Neutrophils Absolute: 1.8 10*3/uL (ref 1.4–7.0)
Neutrophils: 34 %
Platelets: 292 10*3/uL (ref 150–450)
RBC: 4.08 x10E6/uL (ref 3.77–5.28)
RDW: 12.7 % (ref 11.7–15.4)
WBC: 5.4 10*3/uL (ref 3.4–10.8)

## 2022-12-03 LAB — LIPID PANEL W/O CHOL/HDL RATIO
Cholesterol, Total: 131 mg/dL (ref 100–199)
HDL: 50 mg/dL (ref >39)
LDL Chol Calc (NIH): 45 mg/dL (ref 0–99)
Triglycerides: 230 mg/dL — ABNORMAL HIGH (ref 0–149)
VLDL Cholesterol Cal: 36 mg/dL (ref 5–40)

## 2022-12-03 LAB — COMPREHENSIVE METABOLIC PANEL
ALT: 38 IU/L — ABNORMAL HIGH (ref 0–32)
AST: 31 IU/L (ref 0–40)
Albumin/Globulin Ratio: 1.7 (ref 1.2–2.2)
Albumin: 4.6 g/dL (ref 4.0–5.0)
Alkaline Phosphatase: 59 IU/L (ref 44–121)
BUN/Creatinine Ratio: 14 (ref 9–23)
BUN: 11 mg/dL (ref 6–20)
Bilirubin Total: 0.4 mg/dL (ref 0.0–1.2)
CO2: 20 mmol/L (ref 20–29)
Calcium: 9 mg/dL (ref 8.7–10.2)
Chloride: 106 mmol/L (ref 96–106)
Creatinine, Ser: 0.76 mg/dL (ref 0.57–1.00)
Globulin, Total: 2.7 g/dL (ref 1.5–4.5)
Glucose: 98 mg/dL (ref 70–99)
Potassium: 3.8 mmol/L (ref 3.5–5.2)
Sodium: 144 mmol/L (ref 134–144)
Total Protein: 7.3 g/dL (ref 6.0–8.5)
eGFR: 111 mL/min/{1.73_m2} (ref >59)

## 2022-12-03 LAB — TSH: TSH: 1.13 u[IU]/mL (ref 0.450–4.500)

## 2022-12-17 ENCOUNTER — Ambulatory Visit (INDEPENDENT_AMBULATORY_CARE_PROVIDER_SITE_OTHER): Payer: Medicaid Other

## 2022-12-17 VITALS — BP 130/86 | HR 84

## 2022-12-17 DIAGNOSIS — Z0130 Encounter for examination of blood pressure without abnormal findings: Secondary | ICD-10-CM

## 2022-12-17 NOTE — Progress Notes (Unsigned)
Patient forgot to bring in home bo monitor.

## 2022-12-22 ENCOUNTER — Other Ambulatory Visit: Payer: Medicaid Other | Admitting: Hematology and Oncology

## 2022-12-22 ENCOUNTER — Other Ambulatory Visit (HOSPITAL_COMMUNITY)
Admission: RE | Admit: 2022-12-22 | Discharge: 2022-12-22 | Disposition: A | Discharge status: Home / Self Care | Payer: Medicaid Other | Source: Ambulatory Visit | Attending: Obstetrics and Gynecology | Admitting: Obstetrics and Gynecology

## 2022-12-22 DIAGNOSIS — Z124 Encounter for screening for malignant neoplasm of cervix: Secondary | ICD-10-CM

## 2022-12-22 NOTE — Progress Notes (Signed)
Patient: Caroline Scott           Date of Birth: 02/20/1996           MRN: [id number] Visit Date: 12/22/2022 PCP: Pcp, No     Cervical Exam Pap smear completed: Pap test Abnormal Observations: Normal exam. Recommendations: Repeat Pap smear in 1 year due to previous Pap ASCUS/ HPV+ in 2023. She will need 3 normal in a row before spacing out screening.       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  Tobacco Use   Smoking status: Never   Smokeless tobacco: Never  Vaping Use   Vaping Use: Some days   Devices: Hookah once monthly  Substance and Sexual Activity   Alcohol use: Yes    Comment: Once weekly   Drug use: Not Currently    Types: Marijuana    Comment: 2022   Sexual activity: Yes    Birth control/protection: None, Condom

## 2022-12-24 ENCOUNTER — Telehealth: Payer: Self-pay

## 2022-12-24 LAB — CYTOLOGY - PAP: Diagnosis: NEGATIVE

## 2022-12-24 NOTE — Telephone Encounter (Signed)
Patient informed negative Pap results, repeat pa in 1 year, did reveal bacterial vaginosis. Discussed BV with patient and treatment. Patient verbalized understanding. Also,informed to avoid alcohol during treatment. [facility]acy: [facility] [address]

## 2022-12-28 MED ORDER — METRONIDAZOLE 500 MG PO TABS: 500.0000 mg | ORAL_TABLET | Freq: Two times a day (BID) | ORAL | 0 refills | Status: AC

## 2022-12-28 NOTE — Addendum Note (Signed)
Addended by: Ilda Basset on: 12/28/2022 08:54 AM   Modules accepted: Orders

## 2024-07-15 ENCOUNTER — Ambulatory Visit (HOSPITAL_COMMUNITY)
Admission: EM | Admit: 2024-07-15 | Discharge: 2024-07-15 | Disposition: A | Discharge status: Home / Self Care | Attending: Physician Assistant | Admitting: Physician Assistant

## 2024-07-15 ENCOUNTER — Encounter (HOSPITAL_COMMUNITY): Payer: Self-pay

## 2024-07-15 DIAGNOSIS — J209 Acute bronchitis, unspecified: Secondary | ICD-10-CM

## 2024-07-15 DIAGNOSIS — J0120 Acute ethmoidal sinusitis, unspecified: Secondary | ICD-10-CM | POA: Diagnosis not present

## 2024-07-15 MED ORDER — AMOXICILLIN-POT CLAVULANATE 875-125 MG PO TABS: 1.0000 | ORAL_TABLET | Freq: Two times a day (BID) | ORAL | 0 refills | Status: AC

## 2024-07-15 NOTE — ED Triage Notes (Signed)
 Patient reports tht she began with a sore throat 15 days ago, but went away after she began NYquil. Patient states she has had a non productive cough and states she has coughs so hard that she throws up and having rib pain. Patient also reports that she has nasal congestion with yellow blood-tinged mucus. Has had symptoms x 15 days.  Patient reports that she has been taking Dayquil/Nyquil  for her symptoms.

## 2024-07-15 NOTE — Discharge Instructions (Signed)
 Return if any problems.

## 2024-07-15 NOTE — ED Provider Notes (Signed)
 [facility]    CSN: [id number] Arrival date & time: 07/15/24  1537      History   Chief Complaint Chief Complaint  Patient presents with   Nasal Congestion   Cough   Chills    HPI Caroline Scott is a 28 y.o. female.   Patient complains of cough and congestion for the past week.  Patient reports she has had large amount of yellow drainage down the back of her throat and sinus pressure and congestion.  Patient has had sinus infections in the past.  Patient states she is also coughing up phlegm and she is unsure if it is coming from her chest or from her sinuses.  Patient states she has a past medical history of bronchitis  The history is provided by the patient. No language interpreter was used.  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 Authorizing Provider  cetirizine  (ZYRTEC ) 10 MG tablet Take 1 tablet (10 mg total) by mouth daily. Patient not taking: Reported on 07/15/2024 12/02/22   Adella Norris, MD  metroNIDAZOLE  (FLAGYL ) 500 MG tablet Take 1 tablet (500 mg total) by mouth 2 (two) times daily. Patient not taking: Reported on 07/15/2024 12/28/22   Harl Eleanor LABOR, NP    Family History Family History  Problem Relation Age of Onset   Hyperlipidemia Mother    Hypertension Mother    Other Mother         Prediabetes   Cancer Maternal Grandmother    Hypertension Maternal Grandmother    Breast cancer Maternal Grandmother 32   Dementia Maternal Grandmother        Uncertain type--cause of death    Social History Social History[1]   Allergies   Patient has no known allergies.   Review of Systems Review of Systems  Respiratory:  Positive for cough.   All other systems reviewed and are negative.    Physical Exam Triage Vital Signs ED Triage Vitals [07/15/24 1657]  Encounter Vitals Group     BP (!) 139/92     Girls Systolic BP Percentile      Girls Diastolic BP Percentile      Boys Systolic BP Percentile      Boys Diastolic BP Percentile      Pulse Rate (!) 112     Resp 16     Temp 99.7 F (37.6 C)     Temp Source Oral     SpO2 96 %     Weight      Height      Head Circumference  Peak Flow      Pain Score 0     Pain Loc      Pain Education      Exclude from Growth Chart    No data found.  Updated Vital Signs BP (!) 139/92 (BP Location: Left Arm)   Pulse (!) 112   Temp 99.7 F (37.6 C) (Oral)   Resp 16   LMP 06/27/2024 (Exact Date)   SpO2 96%   Visual Acuity Right Eye Distance:   Left Eye Distance:   Bilateral Distance:    Right Eye Near:   Left Eye Near:    Bilateral Near:     Physical Exam Vitals and nursing note reviewed.  Constitutional:      Appearance: She is well-developed.  HENT:     Head: Normocephalic.  Cardiovascular:     Rate and Rhythm: Normal rate.  Pulmonary:     Effort: Pulmonary effort is normal.  Abdominal:     General: There is no distension.  Musculoskeletal:        General: Normal range of motion.     Cervical back: Normal range of motion.  Skin:    General: Skin is warm.  Neurological:     General: No focal deficit present.     Mental Status: She is alert and oriented to person, place, and time.  Psychiatric:        Mood and Affect: Mood normal.      UC Treatments / Results  Labs (all labs ordered are  listed, but only abnormal results are displayed) Labs Reviewed - No data to display  EKG   Radiology No results found.  Procedures Procedures (including critical care time)  Medications Ordered in UC Medications - No data to display  Initial Impression / Assessment and Plan / UC Course  I have reviewed the triage vital signs and the nursing notes.  Pertinent labs & imaging results that were available during my care of the patient were reviewed by me and considered in my medical decision making (see chart for details).     Pt counseled on sinus infections.  Pt advised to return if symptoms worsen or change.  Final Clinical Impressions(s) / UC Diagnoses   Final diagnoses:  Acute ethmoidal sinusitis, recurrence not specified  Acute bronchitis, unspecified organism     Discharge Instructions      Return if any problems.     ED Prescriptions   None    PDMP not reviewed this encounter. An After Visit Summary was printed and given to the patient.        [1]  Social History Tobacco Use   Smoking status: Never   Smokeless tobacco: Never  Vaping Use   Vaping status: Former   Devices: Hookah once monthly  Substance Use Topics   Alcohol use: Yes    Comment: Once weekly   Drug use: Not Currently    Types: Marijuana    Comment: 2022     Flint Sonny POUR, [place] 07/15/24 1925
